# Patient Record
Sex: Male | Born: 1950 | Race: Black or African American | Hispanic: No | Marital: Married | Smoking: Never smoker
Health system: Southern US, Community
[De-identification: ages and names within clinical notes are randomized; demographics above are authoritative.]

## PROBLEM LIST (undated history)

## (undated) DIAGNOSIS — R351 Nocturia: Secondary | ICD-10-CM

## (undated) DIAGNOSIS — K219 Gastro-esophageal reflux disease without esophagitis: Secondary | ICD-10-CM

## (undated) DIAGNOSIS — N411 Chronic prostatitis: Secondary | ICD-10-CM

## (undated) DIAGNOSIS — R079 Chest pain, unspecified: Secondary | ICD-10-CM

## (undated) DIAGNOSIS — R972 Elevated prostate specific antigen [PSA]: Secondary | ICD-10-CM

## (undated) DIAGNOSIS — I10 Essential (primary) hypertension: Secondary | ICD-10-CM

## (undated) DIAGNOSIS — E785 Hyperlipidemia, unspecified: Secondary | ICD-10-CM

## (undated) DIAGNOSIS — E042 Nontoxic multinodular goiter: Secondary | ICD-10-CM

## (undated) DIAGNOSIS — D649 Anemia, unspecified: Secondary | ICD-10-CM

## (undated) DIAGNOSIS — K635 Polyp of colon: Secondary | ICD-10-CM

## (undated) HISTORY — DX: Chronic prostatitis: N41.1

## (undated) HISTORY — DX: Nontoxic multinodular goiter: E04.2

## (undated) HISTORY — PX: HERNIA REPAIR: SHX51

## (undated) HISTORY — PX: COLONOSCOPY: SHX174

## (undated) HISTORY — DX: Hyperlipidemia, unspecified: E78.5

## (undated) HISTORY — DX: Nocturia: R35.1

## (undated) HISTORY — DX: Anemia, unspecified: D64.9

## (undated) HISTORY — DX: Elevated prostate specific antigen (PSA): R97.20

## (undated) HISTORY — DX: Essential (primary) hypertension: I10

---

## 1999-09-26 ENCOUNTER — Ambulatory Visit (HOSPITAL_COMMUNITY): Admission: RE | Admit: 1999-09-26 | Discharge: 1999-09-26 | Payer: Self-pay | Admitting: Gastroenterology

## 2004-12-13 ENCOUNTER — Ambulatory Visit: Payer: Self-pay | Admitting: Family Medicine

## 2005-01-07 ENCOUNTER — Ambulatory Visit: Payer: Self-pay | Admitting: Family Medicine

## 2005-02-13 ENCOUNTER — Ambulatory Visit: Payer: Self-pay | Admitting: Gastroenterology

## 2005-02-27 ENCOUNTER — Ambulatory Visit: Payer: Self-pay | Admitting: Gastroenterology

## 2005-10-01 ENCOUNTER — Ambulatory Visit: Payer: Self-pay | Admitting: Family Medicine

## 2005-11-03 ENCOUNTER — Ambulatory Visit: Payer: Self-pay | Admitting: Family Medicine

## 2006-03-04 ENCOUNTER — Ambulatory Visit: Payer: Self-pay | Admitting: Family Medicine

## 2006-03-04 LAB — CONVERTED CEMR LAB
Basophils Relative: 0.2 % (ref 0.0–1.0)
Bilirubin, Direct: 0.1 mg/dL (ref 0.0–0.3)
CO2: 28 meq/L (ref 19–32)
Eosinophils Relative: 3.4 % (ref 0.0–5.0)
GFR calc Af Amer: 112 mL/min
Glucose, Bld: 92 mg/dL (ref 70–99)
HDL: 41 mg/dL (ref >39.0)
Hemoglobin: 13.1 g/dL (ref 13.0–17.0)
Lymphocytes Relative: 56.5 % — ABNORMAL HIGH (ref 12.0–46.0)
Monocytes Absolute: 0.4 10*3/uL (ref 0.2–0.7)
Neutro Abs: 1.4 10*3/uL (ref 1.4–7.7)
Potassium: 4 meq/L (ref 3.5–5.1)
TSH: 0.73 microintl units/mL (ref 0.35–5.50)
Total Protein: 6.3 g/dL (ref 6.0–8.3)
VLDL: 12 mg/dL (ref 0–40)
WBC: 4.5 10*3/uL (ref 4.5–10.5)

## 2006-03-11 ENCOUNTER — Ambulatory Visit: Payer: Self-pay | Admitting: Family Medicine

## 2006-03-26 ENCOUNTER — Ambulatory Visit: Payer: Self-pay

## 2006-04-20 ENCOUNTER — Ambulatory Visit: Payer: Self-pay

## 2006-04-20 ENCOUNTER — Encounter: Payer: Self-pay | Admitting: Cardiology

## 2006-09-30 ENCOUNTER — Encounter: Payer: Self-pay | Admitting: Family Medicine

## 2007-04-14 ENCOUNTER — Ambulatory Visit: Payer: Self-pay | Admitting: Family Medicine

## 2007-04-14 DIAGNOSIS — Z87448 Personal history of other diseases of urinary system: Secondary | ICD-10-CM | POA: Insufficient documentation

## 2007-04-14 LAB — CONVERTED CEMR LAB
Blood in Urine, dipstick: NEGATIVE
Ketones, urine, test strip: NEGATIVE
Nitrite: NEGATIVE
WBC Urine, dipstick: NEGATIVE

## 2007-04-19 LAB — CONVERTED CEMR LAB
ALT: 22 units/L (ref 0–53)
AST: 25 units/L (ref 0–37)
Albumin: 3.9 g/dL (ref 3.5–5.2)
Alkaline Phosphatase: 62 units/L (ref 39–117)
BUN: 9 mg/dL (ref 6–23)
Basophils Relative: 0 % (ref 0.0–1.0)
CO2: 30 meq/L (ref 19–32)
Chloride: 109 meq/L (ref 96–112)
Creatinine, Ser: 0.9 mg/dL (ref 0.4–1.5)
Eosinophils Absolute: 0.2 10*3/uL (ref 0.0–0.7)
Eosinophils Relative: 4.7 % (ref 0.0–5.0)
Glucose, Bld: 81 mg/dL (ref 70–99)
Lymphocytes Relative: 60.9 % — ABNORMAL HIGH (ref 12.0–46.0)
MCV: 90.1 fL (ref 78.0–100.0)
Monocytes Relative: 7.7 % (ref 3.0–12.0)
Neutrophils Relative %: 26.7 % — ABNORMAL LOW (ref 43.0–77.0)
Phosphorus: 3.1 mg/dL (ref 2.3–4.6)
RBC: 4.5 M/uL (ref 4.22–5.81)
Total CHOL/HDL Ratio: 4.1
Total Protein: 6.8 g/dL (ref 6.0–8.3)
VLDL: 11 mg/dL (ref 0–40)
WBC: 5.2 10*3/uL (ref 4.5–10.5)

## 2007-04-21 ENCOUNTER — Ambulatory Visit: Payer: Self-pay | Admitting: Family Medicine

## 2007-04-21 LAB — FECAL OCCULT BLOOD, GUAIAC: Fecal Occult Blood: NEGATIVE

## 2007-04-22 ENCOUNTER — Encounter: Payer: Self-pay | Admitting: Family Medicine

## 2007-04-30 ENCOUNTER — Ambulatory Visit: Payer: Self-pay | Admitting: Family Medicine

## 2007-05-06 ENCOUNTER — Encounter: Payer: Self-pay | Admitting: Family Medicine

## 2008-05-24 ENCOUNTER — Ambulatory Visit: Payer: Self-pay | Admitting: Family Medicine

## 2008-05-24 LAB — CONVERTED CEMR LAB
Bilirubin Urine: NEGATIVE
Glucose, Urine, Semiquant: NEGATIVE
Ketones, urine, test strip: NEGATIVE
Urobilinogen, UA: 0.2
pH: 6

## 2008-05-31 ENCOUNTER — Ambulatory Visit: Payer: Self-pay | Admitting: Family Medicine

## 2008-05-31 LAB — HM COLONOSCOPY

## 2008-06-09 LAB — CONVERTED CEMR LAB
ALT: 27 units/L (ref 0–53)
BUN: 11 mg/dL (ref 6–23)
Basophils Absolute: 0 10*3/uL (ref 0.0–0.1)
Bilirubin, Direct: 0.2 mg/dL (ref 0.0–0.3)
Chloride: 113 meq/L — ABNORMAL HIGH (ref 96–112)
Cholesterol: 146 mg/dL (ref 0–200)
Creatinine, Ser: 0.9 mg/dL (ref 0.4–1.5)
Eosinophils Absolute: 0.1 10*3/uL (ref 0.0–0.7)
Eosinophils Relative: 2.2 % (ref 0.0–5.0)
GFR calc non Af Amer: 111 mL/min (ref >60)
Glucose, Bld: 95 mg/dL (ref 70–99)
HCT: 38.2 % — ABNORMAL LOW (ref 39.0–52.0)
LDL Cholesterol: 99 mg/dL (ref 0–99)
Lymphs Abs: 2.5 10*3/uL (ref 0.7–4.0)
MCV: 90.1 fL (ref 78.0–100.0)
Monocytes Absolute: 0.4 10*3/uL (ref 0.1–1.0)
Neutrophils Relative %: 29.1 % — ABNORMAL LOW (ref 43.0–77.0)
PSA: 0.91 ng/mL (ref 0.10–4.00)
Platelets: 126 10*3/uL — ABNORMAL LOW (ref 150.0–400.0)
RDW: 12.9 % (ref 11.5–14.6)
TSH: 0.67 microintl units/mL (ref 0.35–5.50)
Total Bilirubin: 1.4 mg/dL — ABNORMAL HIGH (ref 0.3–1.2)
Triglycerides: 74 mg/dL (ref 0.0–149.0)
VLDL: 14.8 mg/dL (ref 0.0–40.0)
WBC: 4.2 10*3/uL — ABNORMAL LOW (ref 4.5–10.5)

## 2008-07-04 ENCOUNTER — Ambulatory Visit: Payer: Self-pay | Admitting: Family Medicine

## 2008-08-04 ENCOUNTER — Telehealth: Payer: Self-pay | Admitting: Family Medicine

## 2008-12-12 ENCOUNTER — Encounter: Payer: Self-pay | Admitting: Family Medicine

## 2009-05-24 ENCOUNTER — Telehealth: Payer: Self-pay | Admitting: Family Medicine

## 2009-06-19 ENCOUNTER — Telehealth: Payer: Self-pay | Admitting: Family Medicine

## 2009-06-19 ENCOUNTER — Ambulatory Visit: Payer: Self-pay | Admitting: Family Medicine

## 2009-06-26 ENCOUNTER — Ambulatory Visit: Payer: Self-pay | Admitting: Family Medicine

## 2009-06-26 DIAGNOSIS — J310 Chronic rhinitis: Secondary | ICD-10-CM | POA: Insufficient documentation

## 2009-06-26 DIAGNOSIS — R351 Nocturia: Secondary | ICD-10-CM

## 2009-06-26 DIAGNOSIS — M1990 Unspecified osteoarthritis, unspecified site: Secondary | ICD-10-CM | POA: Insufficient documentation

## 2009-06-28 LAB — CONVERTED CEMR LAB
Albumin: 3.5 g/dL (ref 3.5–5.2)
Basophils Absolute: 0 10*3/uL (ref 0.0–0.1)
Basophils Relative: 0.7 % (ref 0.0–3.0)
Bilirubin Urine: NEGATIVE
Bilirubin, Direct: 0.2 mg/dL (ref 0.0–0.3)
Blood in Urine, dipstick: NEGATIVE
CO2: 30 meq/L (ref 19–32)
Calcium: 8.6 mg/dL (ref 8.4–10.5)
Chloride: 108 meq/L (ref 96–112)
Cholesterol: 150 mg/dL (ref 0–200)
Creatinine, Ser: 0.9 mg/dL (ref 0.4–1.5)
Eosinophils Absolute: 0.3 10*3/uL (ref 0.0–0.7)
Glucose, Bld: 85 mg/dL (ref 70–99)
Glucose, Urine, Semiquant: NEGATIVE
HDL: 44 mg/dL (ref >39.00)
Ketones, urine, test strip: NEGATIVE
MCHC: 33.2 g/dL (ref 30.0–36.0)
MCV: 91.6 fL (ref 78.0–100.0)
Monocytes Absolute: 0.4 10*3/uL (ref 0.1–1.0)
Neutro Abs: 1.4 10*3/uL (ref 1.4–7.7)
Neutrophils Relative %: 29.9 % — ABNORMAL LOW (ref 43.0–77.0)
Nitrite: NEGATIVE
PSA: 1.07 ng/mL (ref 0.10–4.00)
RBC: 4.11 M/uL — ABNORMAL LOW (ref 4.22–5.81)
RDW: 14.1 % (ref 11.5–14.6)
Specific Gravity, Urine: 1.025
Total CHOL/HDL Ratio: 3
Total Protein: 5.9 g/dL — ABNORMAL LOW (ref 6.0–8.3)
Triglycerides: 54 mg/dL (ref 0.0–149.0)
pH: 6

## 2009-07-05 ENCOUNTER — Ambulatory Visit: Payer: Self-pay | Admitting: Family Medicine

## 2009-07-25 LAB — CONVERTED CEMR LAB
OCCULT 1: NEGATIVE
OCCULT 2: NEGATIVE

## 2009-07-26 ENCOUNTER — Encounter: Payer: Self-pay | Admitting: Family Medicine

## 2010-02-05 ENCOUNTER — Encounter: Payer: Self-pay | Admitting: Internal Medicine

## 2010-02-05 NOTE — Letter (Signed)
Summary: Generic Letter  [place] [facility]  [address] [place], [place] [address]   Phone: [phone]  Fax: [phone]    07/26/2009  JAHZIAH SIMONIN [address] [facility] [address] [place], [place]  [address]  Dear Mr. Ochs,   Hemocult cards were negative.        Sincerely,   Dr Gwenyth Bender Stafford,MD

## 2010-02-05 NOTE — Assessment & Plan Note (Signed)
Summary: CPX // RS   Vital Signs:  Patient profile:   60 year old male Height:      70 inches Weight:      191 pounds BMI:     27.50 O2 Sat:      97 % Temp:     98.7 degrees F Pulse rate:   55 / minute Pulse rhythm:   regular BP sitting:   140 / 94  (left arm) Cuff size:   large  Vitals Entered By: Pura Spice, RN (June 26, 2009 3:03 PM)   Contraindications/Deferment of Procedures/Staging:    Test/Procedure: TD vaccine    Reason for deferment: declined  CC: cpx need   ref[facility]    History of Present Illness: This 60 year old Afro-American male presents for complete physical examination, was married in the past year and went to [place] for a Honeymoon Arthritis does help her much with Celebrex one b.i.d. Bacteria is treated with alternate route for Flolan Flomax, both help Patient went to Dr. Annalee Genta otolaryngologist regarding nasal allergy and was treated Astepro 0.5  %       Allergies (verified): No Known Drug Allergies  Past History:  Risk Factors: Smoking Status: never (09/18/2006)  Review of Systems      See HPI General:  Denies chills, fatigue, fever, loss of appetite, malaise, sleep disorder, sweats, weakness, and weight loss. Eyes:  Denies blurring, discharge, double vision, eye irritation, eye pain, halos, itching, light sensitivity, red eye, vision loss-1 eye, and vision loss-both eyes. ENT:  Complains of nasal congestion; Astepro 0.5 % . CV:  Denies bluish discoloration of lips or nails, chest pain or discomfort, difficulty breathing at night, difficulty breathing while lying down, fainting, fatigue, leg cramps with exertion, lightheadness, near fainting, palpitations, shortness of breath with exertion, swelling of feet, swelling of hands, and weight gain. Resp:  Denies chest discomfort, chest pain with inspiration, cough, coughing up blood, excessive snoring, hypersomnolence, morning headaches, pleuritic, shortness of breath, sputum  productive, and wheezing. GI:  Denies abdominal pain, bloody stools, change in bowel habits, constipation, dark tarry stools, diarrhea, excessive appetite, gas, hemorrhoids, indigestion, loss of appetite, nausea, vomiting, vomiting blood, and yellowish skin color. GU:  Complains of nocturia; Rapoflo or Flomax helped the. MS:  Complains of joint pain; arthritis joint helped by Celebrex 200 mg b.i.d.. Derm:  Denies changes in color of skin, changes in nail beds, dryness, excessive perspiration, flushing, hair loss, insect bite(s), itching, lesion(s), poor wound healing, and rash. Neuro:  Denies brief paralysis, difficulty with concentration, disturbances in coordination, falling down, headaches, inability to speak, memory loss, numbness, poor balance, seizures, sensation of room spinning, tingling, tremors, visual disturbances, and weakness. Psych:  Denies alternate hallucination ( auditory/visual), anxiety, depression, easily angered, easily tearful, irritability, mental problems, panic attacks, sense of great danger, suicidal thoughts/plans, thoughts of violence, unusual visions or sounds, and thoughts /plans of harming others.  Physical Exam  General:  Well-developed,well-nourished,in no acute distress; alert,appropriate and cooperative throughout examination Head:  Normocephalic and atraumatic without obvious abnormalities. No apparent alopecia or balding. Eyes:  No corneal or conjunctival inflammation noted. EOMI. Perrla. Funduscopic exam benign, without hemorrhages, exudates or papilledema. Vision grossly normal. Ears:  External ear exam shows no significant lesions or deformities.  Otoscopic examination reveals clear canals, tympanic membranes are intact bilaterally without bulging, retraction, inflammation or discharge. Hearing is grossly normal bilaterally. Nose:  External nasal examination shows no deformity or inflammation. Nasal mucosa are pink and moist without lesions or  exudates. Mouth:   Oral mucosa and oropharynx without lesions or exudates.  Teeth in good repair. Neck:  No deformities, masses, or tenderness noted. Chest Wall:  No deformities, masses, tenderness or gynecomastia noted. Breasts:  No masses or gynecomastia noted Lungs:  Normal respiratory effort, chest expands symmetrically. Lungs are clear to auscultation, no crackles or wheezes. Heart:  Normal rate and regular rhythm. S1 and S2 normal without gallop, murmur, click, rub or other extra sounds. Abdomen:  Bowel sounds positive,abdomen soft and non-tender without masses, organomegaly or hernias noted. Rectal:  No external abnormalities noted. Normal sphincter tone. No rectal masses or tenderness. Genitalia:  Testes bilaterally descended without nodularity, tenderness or masses. No scrotal masses or lesions. No penis lesions or urethral discharge. Prostate:  1+ enlarged.   Msk:  No deformity or scoliosis noted of thoracic or lumbar spine.   Pulses:  R and L carotid,radial,femoral,dorsalis pedis and posterior tibial pulses are full and equal bilaterally Extremities:  No clubbing, cyanosis, edema, or deformity noted with normal full range of motion of all joints.   Neurologic:  No cranial nerve deficits noted. Station and gait are normal. Plantar reflexes are down-going bilaterally. DTRs are symmetrical throughout. Sensory, motor and coordinative functions appear intact. Skin:  Intact without suspicious lesions or rashes Cervical Nodes:  No lymphadenopathy noted Axillary Nodes:  No palpable lymphadenopathy Inguinal Nodes:  No significant adenopathy Psych:  Cognition and judgment appear intact. Alert and cooperative with normal attention span and concentration. No apparent delusions, illusions, hallucinations   Impression & Recommendations:  Problem # 1:  PHYSICAL EXAMINATION (ICD-V70.0) Assessment Unchanged  Orders: EKG w/ Interpretation (93000)normal electrocardiogram  Problem # 2:  OSTEOARTHRITIS  (ICD-715.90) Assessment: Improved  His updated medication list for this problem includes:    Aspirin 81 Mg Tbec (Aspirin)    Celebrex 200 Mg Caps (Celecoxib) .Marland Kitchen... 1 two times a day  Problem # 3:  RHINITIS (ICD-472.0) Assessment: Improved Asterpro0.5%  Problem # 4:  NOCTURIA (ZOX-096.04) Assessment: Improved Rapaflo or Flomax  Complete Medication List: 1)  Vitamin E 400 Unit Caps (Vitamin e) 2)  Aspirin 81 Mg Tbec (Aspirin) 3)  Celebrex 200 Mg Caps (Celecoxib) .Marland Kitchen.. 1 two times a day 4)  Maxiflu Dm 60-20-400-500 Mg Tabs (Pseudoephedrine-dm-gg-apap) .Marland Kitchen.. 1 morn, midafternoon and hs, for cough and congestipon 5)  Tandem Plus 162-115.2-1 Mg Caps (Fefum-fepo-fa-b cmp-c-zn-mn-cu) .Marland Kitchen.. 1 each day for anemia 6)  Rapaflo 8 Mg Caps (Silodosin) .Marland Kitchen.. 1 each day for better urination and to prevent getting up at night 7)  Flomax 0.4 Mg Caps (Tamsulosin hcl) .Marland Kitchen.. 1 qd 8)  Astepro 0.15 % Soln (Azelastine hcl) .... 2 sprays in each nostril qd  Patient Instructions: 1)  physical examination reveals a healthy male with his problem of arthritis and not use will control 2)  laboratory studies very good Prescriptions: TANDEM PLUS 162-115.2-1 MG CAPS (FEFUM-FEPO-FA-B CMP-C-ZN-MN-CU) 1 each day for anemia  #90 x 3   Entered and Authorized by:   Judithann Sheen MD   Signed by:   Judithann Sheen MD on 06/26/2009   Method used:   Print then Give to Patient   RxID:   [id number] RAPAFLO 8 MG CAPS (SILODOSIN) 1 each day for better urination and to prevent getting up at night  #90 x 3   Entered and Authorized by:   Judithann Sheen MD   Signed by:   Judithann Sheen MD on 06/26/2009   Method used:   Print then  Give to Patient   RxID:   [id number] ASTEPRO 0.15 % SOLN (AZELASTINE HCL) 2 sprays in each nostril qd  #3 x 3   Entered and Authorized by:   Judithann Sheen MD   Signed by:   Judithann Sheen MD on 06/26/2009   Method used:   Print then Give to Patient   RxID:    [id number] FLOMAX 0.4 MG CAPS (TAMSULOSIN HCL) 1 qd  #30 x 11   Entered and Authorized by:   Judithann Sheen MD   Signed by:   Judithann Sheen MD on 06/26/2009   Method used:   Print then Give to Patient   RxID:   [id number]

## 2010-02-05 NOTE — Progress Notes (Signed)
Summary: returning your call   Phone Note Call from Patient   Caller: Patient Summary of Call: returning your call  about his meds  Initial call taken by: Pura Spice, RN,  May 24, 2009 12:10 PM  Follow-up for Phone Call        called by dr Scotty Court. Follow-up by: Pura Spice, RN,  May 24, 2009 2:53 PM

## 2010-02-05 NOTE — Progress Notes (Signed)
Summary: written rx to pt for celebrex   Phone Note Call from Patient   Caller: Patient  walked in  Summary of Call: pt wants written rx   [facility] Initial call taken by: Pura Spice, RN,  June 19, 2009 10:46 AM  Follow-up for Phone Call        ok per dr Alfonzo Feller  Follow-up by: Pura Spice, RN,  June 19, 2009 10:46 AM    New/Updated Medications: CELEBREX 200 MG  CAPS (CELECOXIB) 1 two times a day Prescriptions: CELEBREX 200 MG  CAPS (CELECOXIB) 1 two times a day  #180 x 3   Entered by:   Pura Spice, RN   Authorized by:   Judithann Sheen MD   Signed by:   Pura Spice, RN on 06/19/2009   Method used:   Print then Give to Patient   RxID:   [id number]

## 2010-02-05 NOTE — Consult Note (Signed)
Summary: [facility]  [facility]   Imported By: Maryln Gottron 01/11/2009 09:43:09  _____________________________________________________________________  External Attachment:    Type:   Image     Comment:   External Document  Appended Document: [facility] reviewed

## 2010-02-13 NOTE — Letter (Signed)
Summary: Colonoscopy Letter  [facility]  [address] [place], [place] [address]   Phone: [phone]  Fax: [phone]      February 05, 2010 MRN: [id number]   Stephen Kramer [address] [facility] [address] [place], [place]  [address]   Dear Stephen Kramer,   According to your medical record, it is time for you to schedule a Colonoscopy. The American Cancer Society recommends this procedure as a method to detect early colon cancer. Patients with a family history of colon cancer, or a personal history of colon polyps or inflammatory bowel disease are at increased risk.  This letter has been generated based on the recommendations made at the time of your procedure. If you feel that in your particular situation this may no longer apply, please contact our office.  Please call our office at [phone] to schedule this appointment or to update your records at your earliest convenience.  Thank you for cooperating with [place] to provide you with the very best care possible.   Sincerely,  Wilhemina Bonito. Marina Goodell, M.D.  [facility] [phone]

## 2010-06-25 ENCOUNTER — Other Ambulatory Visit (INDEPENDENT_AMBULATORY_CARE_PROVIDER_SITE_OTHER): Payer: BC Managed Care – PPO

## 2010-06-25 DIAGNOSIS — Z0000 Encounter for general adult medical examination without abnormal findings: Secondary | ICD-10-CM

## 2010-06-25 LAB — CBC WITH DIFFERENTIAL/PLATELET
Basophils Absolute: 0 10*3/uL (ref 0.0–0.1)
HCT: 41 % (ref 39.0–52.0)
Lymphs Abs: 2.7 10*3/uL (ref 0.7–4.0)
MCHC: 33 g/dL (ref 30.0–36.0)
MCV: 91.4 fl (ref 78.0–100.0)
Monocytes Absolute: 0.4 10*3/uL (ref 0.1–1.0)
Platelets: 150 10*3/uL (ref 150.0–400.0)
RDW: 14.6 % (ref 11.5–14.6)

## 2010-06-25 LAB — POCT URINALYSIS DIPSTICK
Blood, UA: NEGATIVE
Glucose, UA: NEGATIVE
Spec Grav, UA: 1.02
Urobilinogen, UA: 0.2

## 2010-06-25 LAB — HEPATIC FUNCTION PANEL
ALT: 23 U/L (ref 0–53)
Total Bilirubin: 1.9 mg/dL — ABNORMAL HIGH (ref 0.3–1.2)

## 2010-06-25 LAB — BASIC METABOLIC PANEL
BUN: 13 mg/dL (ref 6–23)
Chloride: 109 mEq/L (ref 96–112)
Glucose, Bld: 90 mg/dL (ref 70–99)
Potassium: 4.7 mEq/L (ref 3.5–5.1)

## 2010-06-25 LAB — LIPID PANEL
Cholesterol: 153 mg/dL (ref 0–200)
LDL Cholesterol: 98 mg/dL (ref 0–99)
Triglycerides: 63 mg/dL (ref 0.0–149.0)
VLDL: 12.6 mg/dL (ref 0.0–40.0)

## 2010-06-25 LAB — TSH: TSH: 1.01 u[IU]/mL (ref 0.35–5.50)

## 2010-06-28 ENCOUNTER — Encounter: Payer: Self-pay | Admitting: Family Medicine

## 2010-07-02 ENCOUNTER — Ambulatory Visit (INDEPENDENT_AMBULATORY_CARE_PROVIDER_SITE_OTHER): Payer: BC Managed Care – PPO | Admitting: Family Medicine

## 2010-07-02 ENCOUNTER — Encounter: Payer: Self-pay | Admitting: Family Medicine

## 2010-07-02 VITALS — BP 128/80 | HR 56 | Temp 98.7°F | Ht 70.0 in | Wt 185.0 lb

## 2010-07-02 DIAGNOSIS — Z0000 Encounter for general adult medical examination without abnormal findings: Secondary | ICD-10-CM

## 2010-07-02 DIAGNOSIS — M1990 Unspecified osteoarthritis, unspecified site: Secondary | ICD-10-CM

## 2010-07-02 DIAGNOSIS — M129 Arthropathy, unspecified: Secondary | ICD-10-CM

## 2010-07-02 DIAGNOSIS — N419 Inflammatory disease of prostate, unspecified: Secondary | ICD-10-CM

## 2010-07-02 MED ORDER — SILODOSIN 8 MG PO CAPS: 8.0000 mg | ORAL_CAPSULE | Freq: Every day | ORAL | Status: DC

## 2010-07-02 MED ORDER — CELECOXIB 200 MG PO CAPS: 200.0000 mg | ORAL_CAPSULE | Freq: Two times a day (BID) | ORAL | Status: DC

## 2010-07-02 MED ORDER — CIPROFLOXACIN HCL 500 MG PO TABS: 500.0000 mg | ORAL_TABLET | Freq: Two times a day (BID) | ORAL | Status: AC

## 2010-07-18 ENCOUNTER — Telehealth: Payer: Self-pay

## 2010-07-25 NOTE — Telephone Encounter (Signed)
Pt received his medication from mail order.

## 2010-07-26 ENCOUNTER — Encounter: Payer: Self-pay | Admitting: Internal Medicine

## 2010-07-29 ENCOUNTER — Encounter: Payer: Self-pay | Admitting: Family Medicine

## 2010-07-29 NOTE — Progress Notes (Signed)
  Subjective:    Patient ID: Stephen Kramer, male    DOB: 12-19-1950, 60 y.o.   MRN: [id number] This 60 year old Afro-American is in for his yearly physical examination Gen. H. is been doing for well over the past year however at times has symptoms of chronic prostatitis which he had been treated one time in the past year with Cipro 500 mg twice a day for one month he had been doing very well until recently and does have recurrent for increased nocturia as well as some pain over the suprapubic region as well as the perineum he continues to have some joint discomfort but not severe Continued to take Celebrex      HPI    Review of Systems  Constitutional: Negative for activity change, appetite change, fatigue and unexpected weight change.  Eyes: Negative.   Respiratory: Negative.   Cardiovascular: Negative.   Gastrointestinal: Negative.   Genitourinary: Negative for dysuria.       Nocturia. History of chronic prostatitis occasional has suprapubic pain and perineal pain  Musculoskeletal: Positive for arthralgias.  Skin: Negative.   Neurological: Negative.   Psychiatric/Behavioral: Negative.        Objective:   Physical Exam the patient is well-developed well-nourished black male who is in no distress cooperative and pleasant HEENT negative carotid pulses good thyroid is normal Lungs clear to palpation percussion auscultation . No dullness no wheezing no rales Heart no evidence of cardiomegaly heart sounds are good without murmurs rectal rectal peripheral pulses are good and equal bilaterally Abdomen liver spleen kidneys nonpalpable no masses percussion of aorta normal. Bowel sounds  Normal Genitalia normal Rectal examination reveals prostate to be enlarged x2 very tender boggy no nodules Extremities negative Skin        Assessment & Plan:  Physical examination reveals a normal healthy male Osteoarthritis to treat with Celebrex Nocturia to treat with rapoflo Prostatitis  to treat with Cipro 500 mg twice a day x1 month also taking Celebrex helps the discomfort

## 2010-07-29 NOTE — Patient Instructions (Signed)
I found a physical examination in general is very good and very pleased if you have lost some weight laboratory studies were good as we discussed As you know you have prostatitis and we will treat was Cipro 500 mg twice a day for one month Continue Celebrex as prescribed Return in one month for examination

## 2010-08-28 ENCOUNTER — Ambulatory Visit (AMBULATORY_SURGERY_CENTER): Payer: BC Managed Care – PPO | Admitting: *Deleted

## 2010-08-28 VITALS — Ht 70.0 in | Wt 186.0 lb

## 2010-08-28 DIAGNOSIS — Z1211 Encounter for screening for malignant neoplasm of colon: Secondary | ICD-10-CM

## 2010-08-28 MED ORDER — SUPREP BOWEL PREP KIT 17.5-3.13-1.6 GM/177ML PO SOLN: 1.0000 | Freq: Once | ORAL | Status: DC

## 2010-09-11 ENCOUNTER — Ambulatory Visit (AMBULATORY_SURGERY_CENTER): Payer: BC Managed Care – PPO | Admitting: Internal Medicine

## 2010-09-11 ENCOUNTER — Encounter: Payer: Self-pay | Admitting: Internal Medicine

## 2010-09-11 VITALS — BP 137/86 | HR 53 | Temp 97.9°F | Resp 13 | Ht 70.0 in | Wt 186.0 lb

## 2010-09-11 DIAGNOSIS — Z1211 Encounter for screening for malignant neoplasm of colon: Secondary | ICD-10-CM

## 2010-09-11 DIAGNOSIS — Z800 Family history of malignant neoplasm of digestive organs: Secondary | ICD-10-CM

## 2010-09-11 DIAGNOSIS — D126 Benign neoplasm of colon, unspecified: Secondary | ICD-10-CM

## 2010-09-11 MED ORDER — SODIUM CHLORIDE 0.9 % IV SOLN: 500.0000 mL | INTRAVENOUS | Status: DC

## 2010-09-11 NOTE — Patient Instructions (Signed)
Discharge instructions given with verbal understanding. Handouts on polyps,diverticulosis and a high fiber diet given. Resume previous medications. 

## 2010-09-12 ENCOUNTER — Telehealth: Payer: Self-pay | Admitting: *Deleted

## 2010-09-12 NOTE — Telephone Encounter (Signed)
No ID on voice mail.   No message left. 

## 2010-10-17 ENCOUNTER — Other Ambulatory Visit: Payer: Self-pay | Admitting: Family Medicine

## 2010-10-24 ENCOUNTER — Other Ambulatory Visit: Payer: Self-pay | Admitting: Family Medicine

## 2010-10-29 ENCOUNTER — Encounter: Payer: Self-pay | Admitting: Internal Medicine

## 2010-10-29 ENCOUNTER — Ambulatory Visit (INDEPENDENT_AMBULATORY_CARE_PROVIDER_SITE_OTHER): Payer: BC Managed Care – PPO | Admitting: Internal Medicine

## 2010-10-29 VITALS — BP 130/80 | Temp 98.5°F | Wt 189.0 lb

## 2010-10-29 DIAGNOSIS — Z87448 Personal history of other diseases of urinary system: Secondary | ICD-10-CM

## 2010-10-29 NOTE — Patient Instructions (Signed)
Complete Cipro antibiotic therapy  Continue Celebrex twice daily  Call or return to clinic prn if these symptoms worsen or fail to improve as anticipated.

## 2010-10-29 NOTE — Progress Notes (Signed)
  Subjective:    Patient ID: Stephen Kramer, male    DOB: 1950/02/22, 60 y.o.   MRN: [id number]  HPI 60 year old patient who has a greater than 20 year history of chronic recurrent prostatitis. He has been on Cipro for 3 months and states that he is about 60% improved. He states over the past 20 years he has never really been symptom free for any significant length of time. He has been evaluated by urology. Medical regimen includes Celebrex he takes twice daily when he tries to down titrate to once daily he has recurrent symptoms. He is also on rapaflo  8 mg daily. No fever   Review of Systems  Constitutional: Negative for fever, chills, appetite change and fatigue.  HENT: Negative for hearing loss, ear pain, congestion, sore throat, trouble swallowing, neck stiffness, dental problem, voice change and tinnitus.   Eyes: Negative for pain, discharge and visual disturbance.  Respiratory: Negative for cough, chest tightness, wheezing and stridor.   Cardiovascular: Negative for chest pain, palpitations and leg swelling.  Gastrointestinal: Negative for nausea, vomiting, abdominal pain, diarrhea, constipation, blood in stool and abdominal distention.  Genitourinary: Negative for urgency, hematuria, flank pain, discharge, difficulty urinating and genital sores.       Perineal pain  Musculoskeletal: Negative for myalgias, back pain, joint swelling, arthralgias and gait problem.  Skin: Negative for rash.  Neurological: Negative for dizziness, syncope, speech difficulty, weakness, numbness and headaches.  Hematological: Negative for adenopathy. Does not bruise/bleed easily.  Psychiatric/Behavioral: Negative for behavioral problems and dysphoric mood. The patient is not nervous/anxious.        Objective:   Physical Exam  Constitutional: He appears well-developed and well-nourished. No distress.          Assessment & Plan:   Chronic prostatitis. He was recommended that he continue Celebrex  twice a day as well as the alpha-blocker. He will complete Cipro and then will be observed off antibiotic therapy. He will call if there are any recurrent symptoms.

## 2010-10-31 ENCOUNTER — Other Ambulatory Visit: Payer: Self-pay

## 2010-10-31 MED ORDER — CELECOXIB 200 MG PO CAPS: 200.0000 mg | ORAL_CAPSULE | Freq: Two times a day (BID) | ORAL | Status: AC

## 2010-10-31 NOTE — Telephone Encounter (Signed)
Pt need celebrex 200 mg #180 with 3 refills [facility] into [facility] [phone] also call in 2 wk supply to [facility] [phone]. Pt is out of med

## 2010-10-31 NOTE — Telephone Encounter (Signed)
2 week supply sent to to local pharmacy.

## 2010-11-07 ENCOUNTER — Telehealth: Payer: Self-pay | Admitting: Family Medicine

## 2010-11-07 NOTE — Telephone Encounter (Signed)
Patient is confused about his Celebrex refills. Please advise and return his call. Thanks.

## 2010-11-08 MED ORDER — CELECOXIB 200 MG PO CAPS: 200.0000 mg | ORAL_CAPSULE | Freq: Two times a day (BID) | ORAL | Status: AC

## 2010-11-08 NOTE — Telephone Encounter (Signed)
Pt came into the office upset about not receiving his rx prescription by mail order.  Pt told that celebrex rx was sent in on 07/02/10 by Dr. Scotty Court [id number].  Pt states the pharmacy told him the rx was never received. Pt requested to have prescription printed out so that he can take it to the pharmacy.  Ok per Dr. Clent Ridges to print out prescription.  Pt is aware that effective immediately Dr. Scotty Court has retired.

## 2010-11-08 NOTE — Telephone Encounter (Signed)
Left a message for pt to return call 

## 2011-08-06 ENCOUNTER — Telehealth: Payer: Self-pay

## 2011-08-06 NOTE — Telephone Encounter (Signed)
Pt would like a refill on the cipro, pt states that he is still in pain and has seen little improvement in his symptoms. Best# [phone]

## 2011-08-07 NOTE — Telephone Encounter (Signed)
We have not patient for this problem, so he would need to come in for an office visit first.

## 2011-08-07 NOTE — Telephone Encounter (Signed)
Left message on machine that he has not been seen since before 01/2011 and that he would need an OV before we could give him any abx.

## 2011-10-03 ENCOUNTER — Ambulatory Visit (INDEPENDENT_AMBULATORY_CARE_PROVIDER_SITE_OTHER): Payer: BC Managed Care – PPO | Admitting: Urology

## 2011-10-03 DIAGNOSIS — N529 Male erectile dysfunction, unspecified: Secondary | ICD-10-CM

## 2011-10-03 DIAGNOSIS — R3915 Urgency of urination: Secondary | ICD-10-CM

## 2011-10-03 DIAGNOSIS — R3989 Other symptoms and signs involving the genitourinary system: Secondary | ICD-10-CM

## 2011-12-31 ENCOUNTER — Emergency Department (HOSPITAL_COMMUNITY)
Admission: EM | Admit: 2011-12-31 | Discharge: 2011-12-31 | Disposition: A | Discharge status: Home / Self Care | Payer: BC Managed Care – PPO | Attending: Emergency Medicine | Admitting: Emergency Medicine

## 2011-12-31 ENCOUNTER — Encounter (HOSPITAL_COMMUNITY): Payer: Self-pay | Admitting: Emergency Medicine

## 2011-12-31 ENCOUNTER — Emergency Department (HOSPITAL_COMMUNITY): Payer: BC Managed Care – PPO

## 2011-12-31 DIAGNOSIS — J111 Influenza due to unidentified influenza virus with other respiratory manifestations: Secondary | ICD-10-CM

## 2011-12-31 DIAGNOSIS — R109 Unspecified abdominal pain: Secondary | ICD-10-CM | POA: Insufficient documentation

## 2011-12-31 DIAGNOSIS — Z87448 Personal history of other diseases of urinary system: Secondary | ICD-10-CM | POA: Insufficient documentation

## 2011-12-31 DIAGNOSIS — R05 Cough: Secondary | ICD-10-CM | POA: Insufficient documentation

## 2011-12-31 DIAGNOSIS — R509 Fever, unspecified: Secondary | ICD-10-CM | POA: Insufficient documentation

## 2011-12-31 DIAGNOSIS — J1189 Influenza due to unidentified influenza virus with other manifestations: Secondary | ICD-10-CM | POA: Insufficient documentation

## 2011-12-31 DIAGNOSIS — R059 Cough, unspecified: Secondary | ICD-10-CM | POA: Insufficient documentation

## 2011-12-31 DIAGNOSIS — J3489 Other specified disorders of nose and nasal sinuses: Secondary | ICD-10-CM | POA: Insufficient documentation

## 2011-12-31 DIAGNOSIS — R112 Nausea with vomiting, unspecified: Secondary | ICD-10-CM | POA: Insufficient documentation

## 2011-12-31 DIAGNOSIS — R51 Headache: Secondary | ICD-10-CM | POA: Insufficient documentation

## 2011-12-31 MED ORDER — ONDANSETRON 4 MG PO TBDP: 4.0000 mg | ORAL_TABLET | Freq: Three times a day (TID) | ORAL | Status: DC | PRN

## 2011-12-31 MED ORDER — ONDANSETRON 4 MG PO TBDP
4.0000 mg | ORAL_TABLET | Freq: Once | ORAL | Status: AC
  Administered 2011-12-31: 4 mg via ORAL
  Filled 2011-12-31: qty 1

## 2011-12-31 NOTE — ED Provider Notes (Signed)
History  This chart was scribed for Shelda Jakes, MD by Bennett Scrape, ED Scribe. This patient was seen in room APA04/APA04 and the patient's care was started at 7:10 AM.  CSN: [id number]  Arrival date & time 12/31/11  4696   First MD Initiated Contact with Patient 12/31/11 0710      Chief Complaint  Patient presents with  . Generalized Body Aches     The history is provided by the patient. No language interpreter was used.   Stephen Kramer is a 61 y.o. male who presents to the Emergency Department complaining of 2 days of gradual onset, gradually worsening, constant of generalized myalgias with associated nasal congestion, cough productive of phlegm, chills, subjective fevers, mild HA, mild abdominal pain, nausea and 3 episodes of emesis. Temperature is 99.2 in the ED. He reports that the nausea and emesis started yesterday and he denies any emesis episodes today. He states that he has taken 2 doses of Robitussin with no improvement in symptoms. He denies having any sick contacts with similar symptoms. He denies CP, sore throat, rash and diarrhea as associated symptoms. He has a h/o chronic prostatitis and he is an occasional alcohol user but denies smoking. He denies having a flu vaccine this year.  PCP is Dr. Phillips Odor.  Past Medical History  Diagnosis Date  . Chronic prostatitis   . Nocturia     Past Surgical History  Procedure Date  . Colonoscopy     Family History  Problem Relation Age of Onset  . Colon cancer Brother     History  Substance Use Topics  . Smoking status: Never Smoker   . Smokeless tobacco: Never Used  . Alcohol Use: 2.4 oz/week    4 Cans of beer per week      Review of Systems  Constitutional: Positive for fever and chills.  HENT: Positive for congestion. Negative for sore throat.   Respiratory: Positive for cough. Negative for shortness of breath.   Cardiovascular: Negative for chest pain.  Gastrointestinal: Positive for nausea,  vomiting and abdominal pain (mild). Negative for diarrhea.  Genitourinary: Negative for dysuria and hematuria.  Musculoskeletal: Positive for myalgias.  Skin: Negative for rash.  Neurological: Positive for headaches. Negative for seizures.  Hematological: Does not bruise/bleed easily.    Allergies  Review of patient's allergies indicates no known allergies.  Home Medications   Current Outpatient Rx  Name  Route  Sig  Dispense  Refill  . ASPIRIN 81 MG PO TABS   Oral   Take 81 mg by mouth daily as needed.          Marland Kitchen CIPROFLOXACIN HCL 500 MG PO TABS   Oral   Take 500 mg by mouth 2 (two) times daily. For 12 weeks          . ONDANSETRON 4 MG PO TBDP   Oral   Take 1 tablet (4 mg total) by mouth every 8 (eight) hours as needed for nausea.   12 tablet   0   . RAPAFLO 8 MG PO CAPS      TAKE 1 CAPSULE DAILY FOR   BETTER URINATION AND       PREVENT GETTINGUP AT NIGHT   90 capsule   2     Dispense as written.     Triage Vitals: BP 158/81  Pulse 72  Temp 99.2 F (37.3 C) (Oral)  Resp 18  Ht 5\' 10"  (1.778 m)  Wt 188 lb (85.276 kg)  BMI  26.98 kg/m2  SpO2 100%  Physical Exam  Nursing note and vitals reviewed. Constitutional: He is oriented to person, place, and time. He appears well-developed and well-nourished. No distress.  HENT:  Head: Normocephalic and atraumatic.       Moist mucous membranes  Eyes: Conjunctivae normal and EOM are normal. Pupils are equal, round, and reactive to light. No scleral icterus.  Neck: Normal range of motion. Neck supple. No tracheal deviation present.  Cardiovascular: Normal rate and regular rhythm.   No murmur heard. Pulmonary/Chest: Effort normal and breath sounds normal. No respiratory distress.  Abdominal: Soft. Bowel sounds are normal. There is no tenderness.  Musculoskeletal: Normal range of motion. He exhibits no edema.  Lymphadenopathy:    He has no cervical adenopathy.  Neurological: He is alert and oriented to person,  place, and time. No cranial nerve deficit.       Pt able to move both sets of fingers and toes  Skin: Skin is warm and dry.  Psychiatric: He has a normal mood and affect. His behavior is normal.    ED Course  Procedures (including critical care time)  DIAGNOSTIC STUDIES: Oxygen Saturation is 100% on room air, normal by my interpretation.    COORDINATION OF CARE: 7:41 AM- Advised pt that he most likely has something viral and that he will need to drink fluids to stay hydrated. Discussed treatment plan which includes CXR and antiemetics with pt at bedside and pt agreed to plan.  8:15 AM- Ordered one 4 mg Zofran tablet.  9:00 AM- Pt rechecked and feels improved with the Zofran. Informed pt of CXR results. Discussed discharge plan and pt agreed to plan. Also advised pt to follow up with PCP and pt agreed.  Labs Reviewed - No data to display Dg Chest 2 View  12/31/2011  *RADIOLOGY REPORT*  Clinical Data: Myalgias  CHEST - 2 VIEW  Comparison: None.  Findings: Aorta is ectatic and unfolded.  Heart size is normal. Minimal linear right greater than left lower lobe scarring or atelectasis.  Trace pleural effusions are present.  No pneumothorax.  Biapical pleural thickening is noted.  No acute osseous finding.  IMPRESSION: Trace bilateral pleural effusions and probable bilateral lower lobe atelectasis or scarring.   Original Report Authenticated By: Christiana Pellant, M.D.      1. Influenza       MDM  Patient with flulike symptoms now for 2 days. Several episodes of vomiting yesterday nausea persists today. Patient is nontoxic no acute distress. Patient feels better after a liter of normal saline in the ED and Zofran 4 mg ODT. Patient not appropriate candidate for Tamiflu symptoms of origin been present for 2 days. Also patient's symptoms are not very severe. Patient did not have the flu shot. Patient will return for any newer worse symptoms. Patient will work on oral hydration.      I  personally performed the services described in this documentation, which was scribed in my presence. The recorded information has been reviewed and is accurate.     Shelda Jakes, MD 12/31/11 [phone]

## 2011-12-31 NOTE — ED Notes (Signed)
Patient with c/o generalized body aches x 2 days. +n/v.

## 2011-12-31 NOTE — ED Notes (Signed)
MD at bedside. 

## 2012-01-05 ENCOUNTER — Ambulatory Visit (HOSPITAL_COMMUNITY)
Admission: RE | Admit: 2012-01-05 | Discharge: 2012-01-05 | Disposition: A | Discharge status: Home / Self Care | Payer: BC Managed Care – PPO | Source: Ambulatory Visit | Attending: Family Medicine | Admitting: Family Medicine

## 2012-01-05 ENCOUNTER — Other Ambulatory Visit (HOSPITAL_COMMUNITY): Payer: Self-pay | Admitting: Family Medicine

## 2012-01-05 DIAGNOSIS — J111 Influenza due to unidentified influenza virus with other respiratory manifestations: Secondary | ICD-10-CM

## 2012-01-05 DIAGNOSIS — R079 Chest pain, unspecified: Secondary | ICD-10-CM | POA: Insufficient documentation

## 2012-10-14 ENCOUNTER — Ambulatory Visit (INDEPENDENT_AMBULATORY_CARE_PROVIDER_SITE_OTHER): Payer: BC Managed Care – PPO | Admitting: Physician Assistant

## 2012-10-14 VITALS — BP 158/80 | HR 75 | Temp 98.7°F | Resp 18 | Ht 70.0 in | Wt 186.0 lb

## 2012-10-14 DIAGNOSIS — J069 Acute upper respiratory infection, unspecified: Secondary | ICD-10-CM

## 2012-10-14 MED ORDER — BENZONATATE 100 MG PO CAPS: 100.0000 mg | ORAL_CAPSULE | Freq: Three times a day (TID) | ORAL | Status: DC | PRN

## 2012-10-14 MED ORDER — IPRATROPIUM BROMIDE 0.03 % NA SOLN: 2.0000 | Freq: Two times a day (BID) | NASAL | Status: DC

## 2012-10-14 MED ORDER — HYDROCODONE-HOMATROPINE 5-1.5 MG/5ML PO SYRP: 5.0000 mL | ORAL_SOLUTION | Freq: Three times a day (TID) | ORAL | Status: DC | PRN

## 2012-10-14 MED ORDER — CETIRIZINE HCL 10 MG PO TABS: 10.0000 mg | ORAL_TABLET | Freq: Every day | ORAL | Status: DC

## 2012-10-14 NOTE — Patient Instructions (Signed)
Begin using the cetirizine (Zyrtec) once daily to help with post-nasal drainage  Use the ipratropium (Atrovent) nasal spray 2-3 times per day to help with runny nose and post-nasal drainage  Benzonatate (Tessalon Perles) every 8 hours as needed for cough  Hycodan cough syrup (or the cough syrup you have at home) at bed time - this might make you sleepy, so be careful with the first dose  Continue using the neti pot and the Mucinex.  You can also restart the Flonase as well  Drink plenty of fluids (water is best!) and get plenty of rest  If any symptoms are worsening or not improving (fever >101.70F, worsening cough, shortness of breath, wheezing) please let us know   Upper Respiratory Infection, Adult An upper respiratory infection (URI) is also sometimes known as the common cold. The upper respiratory tract includes the nose, sinuses, throat, trachea, and bronchi. Bronchi are the airways leading to the lungs. Most people improve within 1 week, but symptoms can last up to 2 weeks. A residual cough may last even longer.  CAUSES Many different viruses can infect the tissues lining the upper respiratory tract. The tissues become irritated and inflamed and often become very moist. Mucus production is also common. A cold is contagious. You can easily spread the virus to others by oral contact. This includes kissing, sharing a glass, coughing, or sneezing. Touching your mouth or nose and then touching a surface, which is then touched by another person, can also spread the virus. SYMPTOMS  Symptoms typically develop 1 to 3 days after you come in contact with a cold virus. Symptoms vary from person to person. They may include:  Runny nose.  Sneezing.  Nasal congestion.  Sinus irritation.  Sore throat.  Loss of voice (laryngitis).  Cough.  Fatigue.  Muscle aches.  Loss of appetite.  Headache.  Low-grade fever. DIAGNOSIS  You might diagnose your own cold based on familiar symptoms,  since most people get a cold 2 to 3 times a year. Your caregiver can confirm this based on your exam. Most importantly, your caregiver can check that your symptoms are not due to another disease such as strep throat, sinusitis, pneumonia, asthma, or epiglottitis. Blood tests, throat tests, and X-rays are not necessary to diagnose a common cold, but they may sometimes be helpful in excluding other more serious diseases. Your caregiver will decide if any further tests are required. RISKS AND COMPLICATIONS  You may be at risk for a more severe case of the common cold if you smoke cigarettes, have chronic heart disease (such as heart failure) or lung disease (such as asthma), or if you have a weakened immune system. The very young and very old are also at risk for more serious infections. Bacterial sinusitis, middle ear infections, and bacterial pneumonia can complicate the common cold. The common cold can worsen asthma and chronic obstructive pulmonary disease (COPD). Sometimes, these complications can require emergency medical care and may be life-threatening. PREVENTION  The best way to protect against getting a cold is to practice good hygiene. Avoid oral or hand contact with people with cold symptoms. Wash your hands often if contact occurs. There is no clear evidence that vitamin C, vitamin E, echinacea, or exercise reduces the chance of developing a cold. However, it is always recommended to get plenty of rest and practice good nutrition. TREATMENT  Treatment is directed at relieving symptoms. There is no cure. Antibiotics are not effective, because the infection is caused by a virus,  not by bacteria. Treatment may include:  Increased fluid intake. Sports drinks offer valuable electrolytes, sugars, and fluids.  Breathing heated mist or steam (vaporizer or shower).  Eating chicken soup or other clear broths, and maintaining good nutrition.  Getting plenty of rest.  Using gargles or lozenges for  comfort.  Controlling fevers with ibuprofen or acetaminophen as directed by your caregiver.  Increasing usage of your inhaler if you have asthma. Zinc gel and zinc lozenges, taken in the first 24 hours of the common cold, can shorten the duration and lessen the severity of symptoms. Pain medicines may help with fever, muscle aches, and throat pain. A variety of non-prescription medicines are available to treat congestion and runny nose. Your caregiver can make recommendations and may suggest nasal or lung inhalers for other symptoms.  HOME CARE INSTRUCTIONS   Only take over-the-counter or prescription medicines for pain, discomfort, or fever as directed by your caregiver.  Use a warm mist humidifier or inhale steam from a shower to increase air moisture. This may keep secretions moist and make it easier to breathe.  Drink enough water and fluids to keep your urine clear or pale yellow.  Rest as needed.  Return to work when your temperature has returned to normal or as your caregiver advises. You may need to stay home longer to avoid infecting others. You can also use a face mask and careful hand washing to prevent spread of the virus. SEEK MEDICAL CARE IF:   After the first few days, you feel you are getting worse rather than better.  You need your caregiver's advice about medicines to control symptoms.  You develop chills, worsening shortness of breath, or brown or red sputum. These may be signs of pneumonia.  You develop yellow or brown nasal discharge or pain in the face, especially when you bend forward. These may be signs of sinusitis.  You develop a fever, swollen neck glands, pain with swallowing, or white areas in the back of your throat. These may be signs of strep throat. SEEK IMMEDIATE MEDICAL CARE IF:   You have a fever.  You develop severe or persistent headache, ear pain, sinus pain, or chest pain.  You develop wheezing, a prolonged cough, cough up blood, or have a  change in your usual mucus (if you have chronic lung disease).  You develop sore muscles or a stiff neck. Document Released: 06/18/2000 Document Revised: 03/17/2011 Document Reviewed: 04/26/2010 [facility] Patient Information 2014 [place], [place].

## 2012-10-14 NOTE — Progress Notes (Signed)
  Subjective:    Patient ID: Stephen Kramer, male    DOB: [birth year], 62 y.o.   MRN: [id number]  HPI   Stephen Kramer is a very pleasant 62 yr old male here with concern for illness.  Reports about 8 days of cold symptoms - sinus drainage, congestion, scratchy throat, cough.  Wife had been sick with similar symptoms and passed it on to him.  She was started on levaquin, but he does not feel that his symptoms have been as bad.  Cough and ST are most bothersome symptoms.  He is coughing up green sputum.  Actually had some blood tinged sputum at one point but thinks just from coughing so hard.  No fever or chills.  Symptoms do seem to be improving.  No SOB or wheezing.  No asthma.  No smoking.  Cough is worse at night, keeps awake.  Has used Mucinex, Airborne, and neti pot for symptoms.     Review of Systems  Constitutional: Negative for fever and chills.  HENT: Positive for congestion, postnasal drip, rhinorrhea and sore throat. Negative for ear pain.   Respiratory: Positive for cough. Negative for shortness of breath and wheezing.   Cardiovascular: Negative.   Gastrointestinal: Negative.   Musculoskeletal: Negative.   Skin: Negative.   Neurological: Negative.        Objective:   Physical Exam  Vitals reviewed. Constitutional: He is oriented to person, place, and time. He appears well-developed and well-nourished. No distress.  HENT:  Head: Normocephalic and atraumatic.  Right Ear: Tympanic membrane and ear canal normal.  Left Ear: Tympanic membrane and ear canal normal.  Nose: Mucosal edema and rhinorrhea present. Right sinus exhibits no maxillary sinus tenderness and no frontal sinus tenderness. Left sinus exhibits no maxillary sinus tenderness and no frontal sinus tenderness.  Mouth/Throat: Uvula is midline, oropharynx is clear and moist and mucous membranes are normal.  Eyes: Conjunctivae are normal. No scleral icterus.  Neck: Neck supple.  Cardiovascular: Normal rate, regular rhythm  and normal heart sounds.   Pulmonary/Chest: Effort normal and breath sounds normal. He has no wheezes. He has no rales.  Lymphadenopathy:    He has no cervical adenopathy.  Neurological: He is alert and oriented to person, place, and time.  Skin: Skin is warm and dry.  Psychiatric: He has a normal mood and affect. His behavior is normal.        Assessment & Plan:  Viral URI with cough - Plan: cetirizine (ZYRTEC) 10 MG tablet, ipratropium (ATROVENT) 0.03 % nasal spray, benzonatate (TESSALON) 100 MG capsule, HYDROcodone-homatropine (HYCODAN) 5-1.5 MG/5ML syrup   Stephen Kramer is a very pleasant 62 yr old male here with 1 wk of URI symptoms.  Afebrile, VSS, lungs CTA, throat clear.  He actually feels that his symptoms are improving somewhat, but cough is nagging.  Will treat symptoms with zyrtec, atrovent, tessalon, hycodan.  Ibuprofen for sore throat.  Push fluids, rest.  Continue mucinex and neti pot.  Discuss RTC precautions such as fever, worsening cough, SOB, wheezing, change in symptoms.  Pt understands and agrees with this plan.

## 2013-03-09 ENCOUNTER — Other Ambulatory Visit: Payer: Self-pay | Admitting: Dermatology

## 2014-02-10 ENCOUNTER — Emergency Department (HOSPITAL_COMMUNITY): Payer: BLUE CROSS/BLUE SHIELD

## 2014-02-10 ENCOUNTER — Encounter (HOSPITAL_COMMUNITY): Payer: Self-pay | Admitting: Emergency Medicine

## 2014-02-10 ENCOUNTER — Inpatient Hospital Stay (HOSPITAL_COMMUNITY)
Admission: EM | Admit: 2014-02-10 | Discharge: 2014-02-12 | DRG: 311 | Disposition: A | Discharge status: Home / Self Care | Payer: BLUE CROSS/BLUE SHIELD | Attending: Internal Medicine | Admitting: Internal Medicine

## 2014-02-10 DIAGNOSIS — R079 Chest pain, unspecified: Secondary | ICD-10-CM | POA: Diagnosis present

## 2014-02-10 DIAGNOSIS — M1990 Unspecified osteoarthritis, unspecified site: Secondary | ICD-10-CM | POA: Diagnosis present

## 2014-02-10 DIAGNOSIS — Z888 Allergy status to other drugs, medicaments and biological substances status: Secondary | ICD-10-CM

## 2014-02-10 DIAGNOSIS — D649 Anemia, unspecified: Secondary | ICD-10-CM | POA: Diagnosis present

## 2014-02-10 DIAGNOSIS — N411 Chronic prostatitis: Secondary | ICD-10-CM | POA: Diagnosis present

## 2014-02-10 DIAGNOSIS — R001 Bradycardia, unspecified: Secondary | ICD-10-CM | POA: Diagnosis present

## 2014-02-10 DIAGNOSIS — T447X5A Adverse effect of beta-adrenoreceptor antagonists, initial encounter: Secondary | ICD-10-CM | POA: Diagnosis present

## 2014-02-10 DIAGNOSIS — I200 Unstable angina: Secondary | ICD-10-CM | POA: Diagnosis not present

## 2014-02-10 HISTORY — DX: Chest pain, unspecified: R07.9

## 2014-02-10 LAB — COMPREHENSIVE METABOLIC PANEL
ALT: 26 U/L (ref 0–53)
AST: 24 U/L (ref 0–37)
Albumin: 3.9 g/dL (ref 3.5–5.2)
Alkaline Phosphatase: 60 U/L (ref 39–117)
Anion gap: 0 — ABNORMAL LOW (ref 5–15)
BUN: 12 mg/dL (ref 6–23)
CO2: 28 mmol/L (ref 19–32)
Calcium: 8.5 mg/dL (ref 8.4–10.5)
Chloride: 112 mmol/L (ref 96–112)
Creatinine, Ser: 0.82 mg/dL (ref 0.50–1.35)
GFR calc Af Amer: >90 mL/min (ref >90)
GFR calc non Af Amer: >90 mL/min (ref >90)
Glucose, Bld: 103 mg/dL — ABNORMAL HIGH (ref 70–99)
Potassium: 3.7 mmol/L (ref 3.5–5.1)
Sodium: 139 mmol/L (ref 135–145)
Total Bilirubin: 0.9 mg/dL (ref 0.3–1.2)
Total Protein: 6.9 g/dL (ref 6.0–8.3)

## 2014-02-10 LAB — CBC WITH DIFFERENTIAL/PLATELET
Basophils Absolute: 0 10*3/uL (ref 0.0–0.1)
Basophils Relative: 1 % (ref 0–1)
Eosinophils Absolute: 0.3 10*3/uL (ref 0.0–0.7)
Eosinophils Relative: 4 % (ref 0–5)
HCT: 42 % (ref 39.0–52.0)
Hemoglobin: 13.2 g/dL (ref 13.0–17.0)
Lymphocytes Relative: 56 % — ABNORMAL HIGH (ref 12–46)
Lymphs Abs: 3.8 10*3/uL (ref 0.7–4.0)
MCH: 28.4 pg (ref 26.0–34.0)
MCHC: 31.4 g/dL (ref 30.0–36.0)
MCV: 90.5 fL (ref 78.0–100.0)
Monocytes Absolute: 0.5 10*3/uL (ref 0.1–1.0)
Monocytes Relative: 8 % (ref 3–12)
Neutro Abs: 2.1 10*3/uL (ref 1.7–7.7)
Neutrophils Relative %: 31 % — ABNORMAL LOW (ref 43–77)
Platelets: 177 10*3/uL (ref 150–400)
RBC: 4.64 MIL/uL (ref 4.22–5.81)
RDW: 13.9 % (ref 11.5–15.5)
WBC: 6.7 10*3/uL (ref 4.0–10.5)

## 2014-02-10 LAB — TROPONIN I: Troponin I: <0.03 ng/mL (ref <0.031)

## 2014-02-10 MED ORDER — ASPIRIN 81 MG PO CHEW
324.0000 mg | CHEWABLE_TABLET | Freq: Once | ORAL | Status: AC
  Administered 2014-02-10: 324 mg via ORAL
  Filled 2014-02-10: qty 4

## 2014-02-10 MED ORDER — NITROGLYCERIN 0.4 MG SL SUBL
0.4000 mg | SUBLINGUAL_TABLET | SUBLINGUAL | Status: AC | PRN
  Administered 2014-02-10 (×3): 0.4 mg via SUBLINGUAL
  Filled 2014-02-10: qty 1

## 2014-02-10 MED ORDER — NITROGLYCERIN IN D5W 200-5 MCG/ML-% IV SOLN
10.0000 ug/min | INTRAVENOUS | Status: DC
  Administered 2014-02-10: 10 ug/min via INTRAVENOUS
  Filled 2014-02-10: qty 250

## 2014-02-10 MED ORDER — HEPARIN (PORCINE) IN NACL 100-0.45 UNIT/ML-% IJ SOLN
1250.0000 [IU]/h | INTRAMUSCULAR | Status: DC
  Administered 2014-02-11: 1050 [IU]/h via INTRAVENOUS
  Filled 2014-02-10 (×2): qty 250

## 2014-02-10 MED ORDER — HEPARIN BOLUS VIA INFUSION
4000.0000 [IU] | Freq: Once | INTRAVENOUS | Status: AC
  Administered 2014-02-11: 4000 [IU] via INTRAVENOUS

## 2014-02-10 NOTE — ED Notes (Signed)
Patient reports chest pain and chest tightness for 1 week. Denies other symptoms.

## 2014-02-10 NOTE — ED Provider Notes (Addendum)
CSN: [id number]     Arrival date & time 02/10/14  2150 History  This chart was scribed for Delora Fuel, MD by Chester Holstein, ED Scribe. This patient was seen in room APA18/APA18 and the patient's care was started at 11:01 PM.    Chief Complaint  Patient presents with  . Chest Pain    Patient is a 64 y.o. male presenting with chest pain. The history is provided by the patient. No language interpreter was used.  Chest Pain Associated symptoms: shortness of breath   Associated symptoms: no diaphoresis and no nausea    HPI Comments: ORSON RHO is a 64 y.o. male with h/o chronic prostatitis, nocturia, and anemia who presents to the Emergency Department complaining of sharp intermittent 5/10 chest pain with first onset a week ago. Pt notes chest pain usually lasts a few minutes. Pt notes associated SOB.  He states exertion exacerbates the pain, and notes it takes less exertion to aggravate pain now than last week. Pt is currently experiencing 4/10 chest pressure.  Pt has been taking baby ASA for relief, last taken this afternoon. Pt is not a smoker. Pt denies significant cardiac FHx, pain disturbing his sleep, diaphoresis and nausea.    Past Medical History  Diagnosis Date  . Chronic prostatitis   . Nocturia   . Anemia    Past Surgical History  Procedure Laterality Date  . Colonoscopy     Family History  Problem Relation Age of Onset  . Colon cancer Brother   . Hypertension Mother   . Diabetes Mother   . Hypertension Father    History  Substance Use Topics  . Smoking status: Never Smoker   . Smokeless tobacco: Never Used  . Alcohol Use: 2.4 oz/week    4 Cans of beer per week    Review of Systems  Constitutional: Negative for diaphoresis.  Respiratory: Positive for shortness of breath.   Cardiovascular: Positive for chest pain.  Gastrointestinal: Negative for nausea.  All other systems reviewed and are negative.     Allergies  Feldene  Home Medications   Prior  to Admission medications   Medication Sig Start Date End Date Taking? Authorizing Provider  amLODipine (NORVASC) 5 MG tablet Take 5 mg by mouth daily. 12/11/13  Yes Historical Provider, MD  celecoxib (CELEBREX) 200 MG capsule Take 200 mg by mouth 2 (two) times daily.   Yes Historical Provider, MD  cetirizine (ZYRTEC) 10 MG tablet Take 1 tablet (10 mg total) by mouth daily. 10/14/12  Yes Eleanore E Egan, PA-C  ipratropium (ATROVENT) 0.03 % nasal spray Place 2 sprays into the nose 2 (two) times daily. Patient taking differently: Place 2 sprays into the nose 2 (two) times daily as needed for rhinitis.  10/14/12  Yes Eleanore Kurtis Bushman, PA-C  ketoconazole (NIZORAL) 2 % cream Apply 1 application topically 2 (two) times daily. 01/07/14  Yes Historical Provider, MD  benzonatate (TESSALON) 100 MG capsule Take 1-2 capsules (100-200 mg total) by mouth 3 (three) times daily as needed for cough. Patient not taking: Reported on 02/10/2014 10/14/12   Theda Sers, PA-C  HYDROcodone-homatropine [facility]) 5-1.5 MG/5ML syrup Take 5 mLs by mouth every 8 (eight) hours as needed for cough. Patient not taking: Reported on 02/10/2014 10/14/12   Theda Sers, PA-C   BP 180/76 mmHg  Pulse 63  Temp(Src) 98.9 F (37.2 C) (Oral)  Resp 20  Ht 5\' 10"  (1.778 m)  Wt 195 lb (88.451 kg)  BMI 27.98 kg/m2  SpO2 99% Physical Exam  Constitutional: He is oriented to person, place, and time. He appears well-developed and well-nourished.  HENT:  Head: Normocephalic.  Eyes: Conjunctivae are normal. Pupils are equal, round, and reactive to light.  Neck: Normal range of motion. Neck supple. No JVD present.  Cardiovascular: Normal rate, regular rhythm, normal heart sounds and intact distal pulses.  Exam reveals no friction rub.   No murmur heard. Pulmonary/Chest: Effort normal and breath sounds normal. No respiratory distress. He has no wheezes. He has no rales. He exhibits no tenderness.  Abdominal: Soft. Bowel sounds are normal. He  exhibits no distension and no mass. There is no tenderness.  Small umbilical hernia easily reducible.  Musculoskeletal: Normal range of motion. He exhibits no edema.  Lymphadenopathy:    He has no cervical adenopathy.  Neurological: He is alert and oriented to person, place, and time. No cranial nerve deficit. Coordination normal.  Skin: Skin is warm and dry. No rash noted.  Psychiatric: He has a normal mood and affect. His behavior is normal. Thought content normal.  Nursing note and vitals reviewed.   ED Course  Procedures (including critical care time) DIAGNOSTIC STUDIES: Oxygen Saturation is 99% on room air, normal by my interpretation.    COORDINATION OF CARE: 11:13 PM Discussed treatment plan with patient at beside, the patient agrees with the plan and has no further questions at this time.   Labs Review Results for orders placed or performed during the hospital encounter of 02/10/14  CBC with Differential  Result Value Ref Range   WBC 6.7 4.0 - 10.5 K/uL   RBC 4.64 4.22 - 5.81 MIL/uL   Hemoglobin 13.2 13.0 - 17.0 g/dL   HCT 42.0 39.0 - 52.0 %   MCV 90.5 78.0 - 100.0 fL   MCH 28.4 26.0 - 34.0 pg   MCHC 31.4 30.0 - 36.0 g/dL   RDW 13.9 11.5 - 15.5 %   Platelets 177 150 - 400 K/uL   Neutrophils Relative % 31 (L) 43 - 77 %   Neutro Abs 2.1 1.7 - 7.7 K/uL   Lymphocytes Relative 56 (H) 12 - 46 %   Lymphs Abs 3.8 0.7 - 4.0 K/uL   Monocytes Relative 8 3 - 12 %   Monocytes Absolute 0.5 0.1 - 1.0 K/uL   Eosinophils Relative 4 0 - 5 %   Eosinophils Absolute 0.3 0.0 - 0.7 K/uL   Basophils Relative 1 0 - 1 %   Basophils Absolute 0.0 0.0 - 0.1 K/uL  Comprehensive metabolic panel  Result Value Ref Range   Sodium 139 135 - 145 mmol/L   Potassium 3.7 3.5 - 5.1 mmol/L   Chloride 112 96 - 112 mmol/L   CO2 28 19 - 32 mmol/L   Glucose, Bld 103 (H) 70 - 99 mg/dL   BUN 12 6 - 23 mg/dL   Creatinine, Ser 0.82 0.50 - 1.35 mg/dL   Calcium 8.5 8.4 - 10.5 mg/dL   Total Protein 6.9 6.0 -  8.3 g/dL   Albumin 3.9 3.5 - 5.2 g/dL   AST 24 0 - 37 U/L   ALT 26 0 - 53 U/L   Alkaline Phosphatase 60 39 - 117 U/L   Total Bilirubin 0.9 0.3 - 1.2 mg/dL   GFR calc non Af Amer >90 >90 mL/min   GFR calc Af Amer >90 >90 mL/min   Anion gap 0.0 (L) 5 - 15  Troponin I  Result Value Ref Range   Troponin I <0.03 <  0.031 ng/mL    Imaging Review Dg Chest 2 View  02/10/2014   CLINICAL DATA:  Mid chest pain for 1 week.  EXAM: CHEST  2 VIEW  COMPARISON:  01/05/2012  FINDINGS: There is mild hyperinflation. Biapical pleural parenchymal scarring appear similar to prior exam. The heart is normal in size, there is mild tortuosity of the thoracic aorta. Mild atelectasis posteriorly at the lung bases. The lungs are otherwise clear. Pulmonary vasculature is normal. No consolidation, pleural effusion, or pneumothorax. No acute osseous abnormalities are seen.  IMPRESSION: Mild hyperinflation, may reflect bronchitis. There is no localizing pulmonary process.   Electronically Signed   By: Jeb Levering M.D.   On: 02/10/2014 22:54     EKG Interpretation   Date/Time:  Friday February 10 2014 22:02:51 EST Ventricular Rate:  62 PR Interval:  147 QRS Duration: 83 QT Interval:  412 QTC Calculation: 418 R Axis:   12 Text Interpretation:  Sinus rhythm Borderline T abnormalities, inferior  leads Baseline wander in lead(s) V6 No old tracing to compare Confirmed by  [facility]  MD, Eloy Fehl ([id number]) on 02/10/2014 10:59:04 PM      MDM   Final diagnoses:  Unstable angina    Unstable angina based on history. Significant cardiac risk factors are hypertension and family history of coronary artery disease with mother having had coronary disease with onset at about his current age. He is given aspirin and nitroglycerin with partial relief of pain. He is placed on heparin and nitroglycerin drips. Case is discussed with Dr. Kennith Center of cardiology service at [facility] who feels the patient should be admitted to the  hospitalist service with cardiology consultation. Case is discussed with Dr. Shanon Brow of [facility] agrees to come to admit the patient. He will need to be transferred to [facility] for cardiology care.  I personally performed the services described in this documentation, which was scribed in my presence. The recorded information has been reviewed and is accurate.       Delora Fuel, MD 61/60/73 [id number]  He continued to have chest pain in spite of nitroglycerin so was given morphine and eventually became pain-free. Pain recurred so was given additional morphine. ECG is repeated and showing no change from prior ECG. Repeat troponin is pending.   EKG Interpretation   Date/Time:  Saturday February 11 2014 03:30:13 EST Ventricular Rate:  51 PR Interval:  153 QRS Duration: 83 QT Interval:  441 QTC Calculation: 406 R Axis:   25 Text Interpretation:  Sinus rhythm Nonspecific T abnormalities, inferior  leads When compared with ECG of 02/10/2014, No significant change was found  Confirmed by [facility]  MD, Alahni Varone ([id number]) on 02/11/2014 3:41:46 AM        Delora Fuel, MD 54/62/70 [id number]  Jasiel Belisle, MD 93/81/82 [id number]

## 2014-02-10 NOTE — Progress Notes (Signed)
ANTICOAGULATION CONSULT NOTE - Initial Consult  Pharmacy Consult for Heparin   Indication: chest pain/ACS  Allergies  Allergen Reactions  . Feldene [Piroxicam] Other (See Comments)    Diarrhea    Patient Measurements: Height: 5\' 10"  (177.8 cm) Weight: 195 lb (88.451 kg) IBW/kg (Calculated) : 73  Vital Signs: Temp: 98.9 F (37.2 C) (02/05 2153) Temp Source: Oral (02/05 2153) BP: 180/76 mmHg (02/05 2153) Pulse Rate: 63 (02/05 2153)  Labs:  Recent Labs  02/10/14 2208  HGB 13.2  HCT 42.0  PLT 177  CREATININE 0.82  TROPONINI <0.03    Estimated Creatinine Clearance: 102 mL/min (by C-G formula based on Cr of 0.82).   Medical History: Past Medical History  Diagnosis Date  . Chronic prostatitis   . Nocturia   . Anemia     Medications:  Scheduled:  . aspirin  324 mg Oral Once    Assessment: 89 yoM who presents to ED with chest pain.  EKG +T wave abnormalities. Troponins wnl. CBC reviewed.  No bleeding noted.   Goal of Therapy:  Heparin level 0.3-0.7 units/ml Monitor platelets by anticoagulation protocol: Yes   Plan:  Give 4000 units bolus x 1 Start heparin infusion at 1050 units/hr Check anti-Xa level in 6 hours and daily while on heparin Continue to monitor H&H and platelets  Elaine Middleton, Lavonia Drafts 02/10/2014,11:23 PM

## 2014-02-11 DIAGNOSIS — N411 Chronic prostatitis: Secondary | ICD-10-CM | POA: Diagnosis present

## 2014-02-11 DIAGNOSIS — M158 Other polyosteoarthritis: Secondary | ICD-10-CM

## 2014-02-11 DIAGNOSIS — R079 Chest pain, unspecified: Secondary | ICD-10-CM | POA: Diagnosis present

## 2014-02-11 DIAGNOSIS — M1990 Unspecified osteoarthritis, unspecified site: Secondary | ICD-10-CM | POA: Diagnosis present

## 2014-02-11 DIAGNOSIS — I200 Unstable angina: Secondary | ICD-10-CM | POA: Diagnosis present

## 2014-02-11 DIAGNOSIS — R001 Bradycardia, unspecified: Secondary | ICD-10-CM | POA: Diagnosis present

## 2014-02-11 DIAGNOSIS — T447X5A Adverse effect of beta-adrenoreceptor antagonists, initial encounter: Secondary | ICD-10-CM | POA: Diagnosis present

## 2014-02-11 DIAGNOSIS — D649 Anemia, unspecified: Secondary | ICD-10-CM | POA: Diagnosis present

## 2014-02-11 DIAGNOSIS — Z888 Allergy status to other drugs, medicaments and biological substances status: Secondary | ICD-10-CM | POA: Diagnosis not present

## 2014-02-11 LAB — LIPID PANEL
CHOL/HDL RATIO: 3 ratio
CHOLESTEROL: 116 mg/dL (ref 0–200)
HDL: 39 mg/dL — ABNORMAL LOW (ref >39)
LDL CALC: 67 mg/dL (ref 0–99)
TRIGLYCERIDES: 49 mg/dL (ref <150)
VLDL: 10 mg/dL (ref 0–40)

## 2014-02-11 LAB — URINALYSIS, ROUTINE W REFLEX MICROSCOPIC
Bilirubin Urine: NEGATIVE
GLUCOSE, UA: 100 mg/dL — AB
HGB URINE DIPSTICK: NEGATIVE
KETONES UR: NEGATIVE mg/dL
LEUKOCYTES UA: NEGATIVE
Nitrite: NEGATIVE
PROTEIN: NEGATIVE mg/dL
SPECIFIC GRAVITY, URINE: 1.019 (ref 1.005–1.030)
UROBILINOGEN UA: 0.2 mg/dL (ref 0.0–1.0)
pH: 6.5 (ref 5.0–8.0)

## 2014-02-11 LAB — CBC
HCT: 38.8 % — ABNORMAL LOW (ref 39.0–52.0)
HEMATOCRIT: 37.1 % — AB (ref 39.0–52.0)
HEMOGLOBIN: 11.8 g/dL — AB (ref 13.0–17.0)
Hemoglobin: 12.2 g/dL — ABNORMAL LOW (ref 13.0–17.0)
MCH: 28.8 pg (ref 26.0–34.0)
MCH: 28.7 pg (ref 26.0–34.0)
MCHC: 31.8 g/dL (ref 30.0–36.0)
MCHC: 31.4 g/dL (ref 30.0–36.0)
MCV: 90.5 fL (ref 78.0–100.0)
MCV: 91.3 fL (ref 78.0–100.0)
Platelets: 144 10*3/uL — ABNORMAL LOW (ref 150–400)
Platelets: 152 10*3/uL (ref 150–400)
RBC: 4.1 MIL/uL — AB (ref 4.22–5.81)
RBC: 4.25 MIL/uL (ref 4.22–5.81)
RDW: 13.7 % (ref 11.5–15.5)
RDW: 14 % (ref 11.5–15.5)
WBC: 5.5 10*3/uL (ref 4.0–10.5)
WBC: 5.9 10*3/uL (ref 4.0–10.5)

## 2014-02-11 LAB — BASIC METABOLIC PANEL
ANION GAP: 1 — AB (ref 5–15)
BUN: 11 mg/dL (ref 6–23)
CALCIUM: 7.9 mg/dL — AB (ref 8.4–10.5)
CHLORIDE: 113 mmol/L — AB (ref 96–112)
CO2: 26 mmol/L (ref 19–32)
CREATININE: 0.75 mg/dL (ref 0.50–1.35)
GLUCOSE: 85 mg/dL (ref 70–99)
POTASSIUM: 3.7 mmol/L (ref 3.5–5.1)
Sodium: 140 mmol/L (ref 135–145)

## 2014-02-11 LAB — HEPARIN LEVEL (UNFRACTIONATED): Heparin Unfractionated: 0.27 IU/mL — ABNORMAL LOW (ref 0.30–0.70)

## 2014-02-11 LAB — TROPONIN I
Troponin I: <0.03 ng/mL (ref <0.031)
Troponin I: <0.03 ng/mL (ref <0.031)

## 2014-02-11 LAB — PROTIME-INR
INR: 1.03 (ref 0.00–1.49)
PROTHROMBIN TIME: 13.6 s (ref 11.6–15.2)

## 2014-02-11 LAB — MRSA PCR SCREENING: MRSA BY PCR: NEGATIVE

## 2014-02-11 MED ORDER — MORPHINE SULFATE 4 MG/ML IJ SOLN
4.0000 mg | Freq: Once | INTRAMUSCULAR | Status: AC
  Administered 2014-02-11: 4 mg via INTRAVENOUS
  Filled 2014-02-11: qty 1

## 2014-02-11 MED ORDER — ONDANSETRON HCL 4 MG/2ML IJ SOLN
4.0000 mg | Freq: Four times a day (QID) | INTRAMUSCULAR | Status: DC | PRN
  Administered 2014-02-11: 4 mg via INTRAVENOUS
  Filled 2014-02-11: qty 2

## 2014-02-11 MED ORDER — MORPHINE SULFATE 2 MG/ML IJ SOLN
2.0000 mg | Freq: Once | INTRAMUSCULAR | Status: AC
  Administered 2014-02-11: 2 mg via INTRAVENOUS
  Filled 2014-02-11: qty 1

## 2014-02-11 MED ORDER — SODIUM CHLORIDE 0.9 % IJ SOLN
3.0000 mL | Freq: Two times a day (BID) | INTRAMUSCULAR | Status: DC
  Administered 2014-02-11: 3 mL via INTRAVENOUS

## 2014-02-11 MED ORDER — AMITRIPTYLINE HCL 10 MG PO TABS
10.0000 mg | ORAL_TABLET | Freq: Every day | ORAL | Status: DC
  Administered 2014-02-11: 10 mg via ORAL
  Filled 2014-02-11 (×2): qty 1

## 2014-02-11 MED ORDER — SODIUM CHLORIDE 0.9 % IV SOLN: 250.0000 mL | INTRAVENOUS | Status: DC | PRN

## 2014-02-11 MED ORDER — ASPIRIN EC 81 MG PO TBEC
81.0000 mg | DELAYED_RELEASE_TABLET | Freq: Every day | ORAL | Status: DC
  Filled 2014-02-11: qty 1

## 2014-02-11 MED ORDER — METOPROLOL TARTRATE 25 MG PO TABS
25.0000 mg | ORAL_TABLET | Freq: Two times a day (BID) | ORAL | Status: DC
  Filled 2014-02-11: qty 1

## 2014-02-11 MED ORDER — MORPHINE SULFATE 4 MG/ML IJ SOLN
4.0000 mg | INTRAMUSCULAR | Status: AC | PRN
  Administered 2014-02-11 (×3): 4 mg via INTRAVENOUS
  Filled 2014-02-11 (×3): qty 1

## 2014-02-11 MED ORDER — ATORVASTATIN CALCIUM 20 MG PO TABS
20.0000 mg | ORAL_TABLET | Freq: Every day | ORAL | Status: DC
  Filled 2014-02-11 (×2): qty 1

## 2014-02-11 MED ORDER — HYDROCODONE-ACETAMINOPHEN 5-325 MG PO TABS
1.0000 | ORAL_TABLET | Freq: Once | ORAL | Status: AC
  Administered 2014-02-11: 1 via ORAL
  Filled 2014-02-11: qty 1

## 2014-02-11 MED ORDER — SODIUM CHLORIDE 0.9 % IJ SOLN: 3.0000 mL | INTRAMUSCULAR | Status: DC | PRN

## 2014-02-11 MED ORDER — ACETAMINOPHEN 325 MG PO TABS: 650.0000 mg | ORAL_TABLET | ORAL | Status: DC | PRN

## 2014-02-11 NOTE — Progress Notes (Signed)
[facility] for Heparin   Indication: chest pain/ACS  Allergies  Allergen Reactions  . Feldene [Piroxicam] Other (See Comments)    Diarrhea    Patient Measurements: Height: 5\' 10"  (177.8 cm) Weight: 195 lb (88.451 kg) IBW/kg (Calculated) : 73  Vital Signs: Temp: 98.9 F (37.2 C) (02/05 2153) Temp Source: Oral (02/05 2153) BP: 122/74 mmHg (02/06 0830) Pulse Rate: 54 (02/06 0830)  Labs:  Recent Labs  02/10/14 2208 02/11/14 0354 02/11/14 0721  HGB 13.2 11.8* 12.2*  HCT 42.0 37.1* 38.8*  PLT 177 144* 152  LABPROT  --   --  13.6  INR  --   --  1.03  HEPARINUNFRC  --   --  0.27*  CREATININE 0.82  --  0.75  TROPONINI <0.03 <0.03 <0.03    Estimated Creatinine Clearance: 104.5 mL/min (by C-G formula based on Cr of 0.75).   Medical History: Past Medical History  Diagnosis Date  . Chronic prostatitis   . Nocturia   . Anemia     Medications:  Scheduled:  . [START ON 02/12/2014] aspirin EC  81 mg Oral Daily  . atorvastatin  20 mg Oral q1800  . metoprolol tartrate  25 mg Oral BID  . sodium chloride  3 mL Intravenous Q12H    Assessment: 28 yoM who presents to ED with chest pain.  EKG +T wave abnormalities. Troponins wnl. CBC reviewed.  No bleeding noted.  Initial heparin level slightly below goal range. Noted plan to transfer to [facility] for cardiology consult once bed available.   Goal of Therapy:  Heparin level 0.3-0.7 units/ml Monitor platelets by anticoagulation protocol: Yes   Plan:  Increase heparin infusion to 1250 units/hr Recheck 6hr heparin level Daily heparin level & CBC  Biagio Borg 02/11/2014,9:04 AM

## 2014-02-11 NOTE — Progress Notes (Signed)
Utilization Review completed.  

## 2014-02-11 NOTE — H&P (Signed)
PCP:   [name], MD   Chief Complaint:  Chest pain  HPI: 64 yo male with one week history of sscp that is worse with exertion and relieved with resting without any radiation sometimes associated with sob, no n/v.  No fevers no cough.  No pnd or orthopnea.  No le edema or swelling.  No prior cardiac w/u, but mother had cabg in her 76s.  Pt was given ntg sl on arrival to ED which totally relieved his pain.  Pain not worse with deep inspiration.  Chest pain free at this time on ntg and hep drips.  Review of Systems:  Positive and negative as per HPI otherwise all other systems are negative  Past Medical History: Past Medical History  Diagnosis Date  . Chronic prostatitis   . Nocturia   . Anemia    Past Surgical History  Procedure Laterality Date  . Colonoscopy      Medications: Prior to Admission medications   Medication Sig Start Date End Date Taking? Authorizing Provider  amLODipine (NORVASC) 5 MG tablet Take 5 mg by mouth daily. 12/11/13  Yes Historical Provider, MD  celecoxib (CELEBREX) 200 MG capsule Take 200 mg by mouth 2 (two) times daily.   Yes Historical Provider, MD  cetirizine (ZYRTEC) 10 MG tablet Take 1 tablet (10 mg total) by mouth daily. 10/14/12  Yes Eleanore E Egan, PA-C  ipratropium (ATROVENT) 0.03 % nasal spray Place 2 sprays into the nose 2 (two) times daily. Patient taking differently: Place 2 sprays into the nose 2 (two) times daily as needed for rhinitis.  10/14/12  Yes Eleanore Kurtis Bushman, PA-C  ketoconazole (NIZORAL) 2 % cream Apply 1 application topically 2 (two) times daily. 01/07/14  Yes Historical Provider, MD  benzonatate (TESSALON) 100 MG capsule Take 1-2 capsules (100-200 mg total) by mouth 3 (three) times daily as needed for cough. Patient not taking: Reported on 02/10/2014 10/14/12   Theda Sers, PA-C  HYDROcodone-homatropine [facility]) 5-1.5 MG/5ML syrup Take 5 mLs by mouth every 8 (eight) hours as needed for cough. Patient not taking: Reported  on 02/10/2014 10/14/12   Theda Sers, PA-C    Allergies:   Allergies  Allergen Reactions  . Feldene [Piroxicam] Other (See Comments)    Diarrhea    Social History:  reports that he has never smoked. He has never used smokeless tobacco. He reports that he drinks about 2.4 oz of alcohol per week. He reports that he does not use illicit drugs.  Family History: Family History  Problem Relation Age of Onset  . Colon cancer Brother   . Hypertension Mother   . Diabetes Mother   . Hypertension Father     Physical Exam: Filed Vitals:   02/10/14 2345 02/10/14 2359 02/11/14 0000 02/11/14 0015  BP:  123/79 123/79 120/72  Pulse: 59 62 58 57  Temp:      TempSrc:      Resp: 13 16 12 12   Height:      Weight:      SpO2: 96% 97% 97% 97%   General appearance: alert, cooperative and no distress Head: Normocephalic, without obvious abnormality, atraumatic Eyes: negative Nose: Nares normal. Septum midline. Mucosa normal. No drainage or sinus tenderness. Neck: no JVD and supple, symmetrical, trachea midline Lungs: clear to auscultation bilaterally Heart: regular rate and rhythm, S1, S2 normal, no murmur, click, rub or gallop Abdomen: soft, non-tender; bowel sounds normal; no masses,  no organomegaly Extremities: extremities normal, atraumatic, no cyanosis or  edema Pulses: 2+ and symmetric Skin: Skin color, texture, turgor normal. No rashes or lesions Neurologic: Grossly normal  Labs on Admission:   Recent Labs  02/10/14 2208  NA 139  K 3.7  CL 112  CO2 28  GLUCOSE 103*  BUN 12  CREATININE 0.82  CALCIUM 8.5    Recent Labs  02/10/14 2208  AST 24  ALT 26  ALKPHOS 60  BILITOT 0.9  PROT 6.9  ALBUMIN 3.9    Recent Labs  02/10/14 2208  WBC 6.7  NEUTROABS 2.1  HGB 13.2  HCT 42.0  MCV 90.5  PLT 177    Recent Labs  02/10/14 2208  TROPONINI <0.03   Radiological Exams on Admission: Dg Chest 2 View  02/10/2014   CLINICAL DATA:  Mid chest pain for 1 week.  EXAM:  CHEST  2 VIEW  COMPARISON:  01/05/2012  FINDINGS: There is mild hyperinflation. Biapical pleural parenchymal scarring appear similar to prior exam. The heart is normal in size, there is mild tortuosity of the thoracic aorta. Mild atelectasis posteriorly at the lung bases. The lungs are otherwise clear. Pulmonary vasculature is normal. No consolidation, pleural effusion, or pneumothorax. No acute osseous abnormalities are seen.  IMPRESSION: Mild hyperinflation, may reflect bronchitis. There is no localizing pulmonary process.   Electronically Signed   By: Jeb Levering M.D.   On: 02/10/2014 22:54    Assessment/Plan  64 yo male with unstable angina  Principal Problem:   Unstable angina-  Hep gtt, ntg gtt.  acs protocol.  [facility]iology at [facility] called for consult who requested [facility] to [facility] for their evaluation.  Order written to call cards team on arrival to [facility] for further w/u recommendations.  Pt currently cp free and stable.  Daily aspirin.  Has strong family history of early CAD in his mother.  Active Problems:  Stable unless o/w noted   Osteoarthritis   Anemia  obs on stepdown at [facility].  Full code.  Alif Petrak A 02/11/2014, 12:40 AM

## 2014-02-11 NOTE — ED Notes (Signed)
Pt sleeping. 

## 2014-02-11 NOTE — Progress Notes (Signed)
[facility] hospital[facility] progress note. Chief complaint. Chest pressure. History of present illness. This 64 year old male in hospital with complaints of chest pain. He was initially placed on heparin and nitroglycerin however these were discontinued when patient ruled out with normal troponins 3. He now complains of chest pressure. A request nursing obtain a 12-lead EKG. This has resulted and shows sinus bradycardia without evidence of acute ischemia. I came up to evaluate the patient at bedside. He describes a sensation of chest tightness over the central chest. There is no associated nausea, diaphoresis, or radiation. Vital signs. Temperature 98.5, pulse 65, respiration 16, blood pressure 118/63. O2 sats 96%. General appearance. Well-developed white male who is alert and in no distress. Cardiac. Rate and rhythm regular. Lungs. Breath sounds clear and equal. Abdomen. Soft with positive bowel sounds. Impression/plan. Problem #1. Chest pressure. Current EKG does not suggest acute ischemia. Nonetheless we'll obtain an additional troponin to evaluate for any abnormality.

## 2014-02-11 NOTE — ED Notes (Signed)
edp aware of pt's pain level and blood pressure. Orders received

## 2014-02-11 NOTE — Progress Notes (Signed)
Patient admitted after midnight. Cart reviewed. patient examined. Transfer from [facility] ED. No chest pain currently. MI ruled out. D/c heparin gtt and nitro gtt.. D/c metoprolol due to bradycardia. D/w Dr. Aundra Dubin. He recommends myoview tomorrow, as MI ruled out. Updated wife.   Stephen Barthel MD [facility] [email or web address]

## 2014-02-12 ENCOUNTER — Encounter (HOSPITAL_COMMUNITY): Payer: Self-pay | Admitting: Nurse Practitioner

## 2014-02-12 ENCOUNTER — Inpatient Hospital Stay (HOSPITAL_COMMUNITY): Payer: BLUE CROSS/BLUE SHIELD

## 2014-02-12 DIAGNOSIS — R079 Chest pain, unspecified: Secondary | ICD-10-CM

## 2014-02-12 LAB — CBC
HCT: 38.1 % — ABNORMAL LOW (ref 39.0–52.0)
Hemoglobin: 12.4 g/dL — ABNORMAL LOW (ref 13.0–17.0)
MCH: 28.8 pg (ref 26.0–34.0)
MCHC: 32.5 g/dL (ref 30.0–36.0)
MCV: 88.4 fL (ref 78.0–100.0)
Platelets: 165 10*3/uL (ref 150–400)
RBC: 4.31 MIL/uL (ref 4.22–5.81)
RDW: 13.9 % (ref 11.5–15.5)
WBC: 8.6 10*3/uL (ref 4.0–10.5)

## 2014-02-12 LAB — TROPONIN I: Troponin I: <0.03 ng/mL (ref <0.031)

## 2014-02-12 MED ORDER — AMITRIPTYLINE HCL 10 MG PO TABS: 10.0000 mg | ORAL_TABLET | Freq: Every day | ORAL | Status: DC

## 2014-02-12 MED ORDER — TECHNETIUM TC 99M SESTAMIBI - CARDIOLITE
30.0000 | Freq: Once | INTRAVENOUS | Status: AC | PRN
  Administered 2014-02-12: 12:00:00 30 via INTRAVENOUS

## 2014-02-12 MED ORDER — PANTOPRAZOLE SODIUM 40 MG PO TBEC: 40.0000 mg | DELAYED_RELEASE_TABLET | Freq: Every day | ORAL | Status: DC

## 2014-02-12 MED ORDER — TECHNETIUM TC 99M SESTAMIBI GENERIC - CARDIOLITE
10.0000 | Freq: Once | INTRAVENOUS | Status: AC | PRN
  Administered 2014-02-12: 10 via INTRAVENOUS

## 2014-02-12 NOTE — Discharge Summary (Signed)
Physician Discharge Summary  Stephen Kramer CBJ:[id number] DOB: January 02, 1951 DOA: 02/10/2014  PCP: [name], MD  Admit date: 02/10/2014 Discharge date: 02/12/2014  Time spent: greater than 30 minutes  Recommendations for Outpatient Follow-up:  1.   Discharge Diagnoses:  Principal Problem:   Chest pain, suspect GI etiology Active Problems: Chronic prostadinia   Anemia   Discharge Condition: stable  Filed Weights   02/10/14 2153 02/11/14 1208 02/11/14 1600  Weight: 88.451 kg (195 lb) 88.451 kg (195 lb) 87.998 kg (194 lb)    History of present illness:  64 yo male with one week history of sscp that is worse with exertion and relieved with resting without any radiation sometimes associated with sob, no n/v. No fevers no cough. No pnd or orthopnea. No le edema or swelling. No prior cardiac w/u, but mother had cabg in her 46s. Pt was given ntg sl on arrival to ED which totally relieved his pain. Pain not worse with deep inspiration. Chest pain free at this time on ntg and hep drips.  Presents to [facility] Course:  EKG with  Nonspecific changes and troponin in ED negative.  Patient started on heparin gtt and nitro gtt in ED.  EDP discussed with [facility] who recommended transfer to [facility].  MI ruled out.  Heparin gtt and nitro gtt. Stopped. I discussed case with Dr. Aundra Dubin who recommended stress myoview.  Myoview low risk.  Patient has been on celebrex long term for chronic prostate pain (previously diagnosed with interstitial cystitis.  Chest pain may be GI in nature. Patient did c/o increase in heartburn recently. celebrex stopped. Started on empiric PPI.  Started on elavil. Patient would like to try this for chronic prostate/bladder pain. No further chest pain at discharge  Procedures:  none  Consultations:  none  Discharge Exam: Filed Vitals:   02/12/14 1147  BP:   Pulse: 160  Temp:   Resp:     General: a and o,  comfortable Cardiovascular: RRR. No CW tenderness. Respiratory: CTA  Discharge Instructions   Discharge Instructions    Activity as tolerated - No restrictions    Complete by:  As directed      Diet - low sodium heart healthy    Complete by:  As directed           Current Discharge Medication List    START taking these medications   Details  amitriptyline (ELAVIL) 10 MG tablet Take 1 tablet (10 mg total) by mouth at bedtime. Qty: 30 tablet, Refills: 0    pantoprazole (PROTONIX) 40 MG tablet Take 1 tablet (40 mg total) by mouth daily. Qty: 30 tablet, Refills: 0      CONTINUE these medications which have NOT CHANGED   Details  amLODipine (NORVASC) 5 MG tablet Take 5 mg by mouth daily. Refills: 5    cetirizine (ZYRTEC) 10 MG tablet Take 1 tablet (10 mg total) by mouth daily. Qty: 30 tablet, Refills: 11   Associated Diagnoses: Viral URI with cough    ipratropium (ATROVENT) 0.03 % nasal spray Place 2 sprays into the nose 2 (two) times daily. Qty: 30 mL, Refills: 1   Associated Diagnoses: Viral URI with cough    ketoconazole (NIZORAL) 2 % cream Apply 1 application topically 2 (two) times daily. Refills: 2      STOP taking these medications     celecoxib (CELEBREX) 200 MG capsule      benzonatate (TESSALON) 100 MG capsule  HYDROcodone-homatropine (HYCODAN) 5-1.5 MG/5ML syrup        Allergies  Allergen Reactions  . Feldene [Piroxicam] Other (See Comments)    Diarrhea   Follow-up Information    Follow up with [name], MD In 4 weeks.   Specialty:  Family Medicine   Contact information:   [address] [place] [address] [phone]        The results of significant diagnostics from this hospitalization (including imaging, microbiology, ancillary and laboratory) are listed below for reference.    Significant Diagnostic Studies: Dg Chest 2 View  02/10/2014   CLINICAL DATA:  Mid chest pain for 1 week.  EXAM: CHEST  2 VIEW   COMPARISON:  01/05/2012  FINDINGS: There is mild hyperinflation. Biapical pleural parenchymal scarring appear similar to prior exam. The heart is normal in size, there is mild tortuosity of the thoracic aorta. Mild atelectasis posteriorly at the lung bases. The lungs are otherwise clear. Pulmonary vasculature is normal. No consolidation, pleural effusion, or pneumothorax. No acute osseous abnormalities are seen.  IMPRESSION: Mild hyperinflation, may reflect bronchitis. There is no localizing pulmonary process.   Electronically Signed   By: Jeb Levering M.D.   On: 02/10/2014 22:54   Nm Myocar Multi W/spect W/wall Motion / Ef  02/12/2014   CLINICAL DATA:  Chest pain  EXAM: MYOCARDIAL IMAGING WITH SPECT (REST AND EXERCISE)  GATED LEFT VENTRICULAR WALL MOTION STUDY  LEFT VENTRICULAR EJECTION FRACTION  TECHNIQUE: Standard myocardial SPECT imaging was performed after resting intravenous injection of 10 mCi Tc-49m sestamibi. Subsequently, exercise tolerance test was performed by the patient under the supervision of the Cardiology staff. At peak-stress, 30 mCi Tc-69m sestamibi was injected intravenously and standard myocardial SPECT imaging was performed. The patient was able to achieve 102% of maximal, age predicted heart rate. Quantitative gated imaging was also performed to evaluate left ventricular wall motion, and estimate left ventricular ejection fraction.  COMPARISON:  Chest radiograph - 02/10/2014  FINDINGS: Raw images: Mild patient motion artifact is seen on the provided rest images. No significant chest wall attenuation is seen on either the rest or stress images  Perfusion: No decreased activity in the left ventricle on stress imaging to suggest reversible ischemia or infarction.  Wall Motion: Normal left ventricular wall motion. No left ventricular dilation.  Left Ventricular Ejection Fraction: 67 %  End diastolic volume 903 ml  End systolic volume 39 ml  IMPRESSION: 1. No scintigraphic evidence of prior  infarction or pharmacologically ischemia.  2. Normal left ventricular wall motion.  3. Left ventricular ejection fraction 67%  4. Low-risk stress test findings*.  *2012 Appropriate Use Criteria for Coronary Revascularization Focused Update: J Am Coll Cardiol. 0092;33(0):076-226. http://content.airportbarriers.com.aspx?articleid=1201161   Electronically Signed   By: Sandi Mariscal M.D.   On: 02/12/2014 13:22    Microbiology: Recent Results (from the past 240 hour(s))  MRSA PCR Screening     Status: None   Collection Time: 02/11/14  1:42 PM  Result Value Ref Range Status   MRSA by PCR NEGATIVE NEGATIVE Final    Comment:        The GeneXpert MRSA Assay (FDA approved for NASAL specimens only), is one component of a comprehensive MRSA colonization surveillance program. It is not intended to diagnose MRSA infection nor to guide or monitor treatment for MRSA infections.      Labs: Basic Metabolic Panel:  Recent Labs Lab 02/10/14 2208 02/11/14 0721  NA 139 140  K 3.7 3.7  CL 112 113*  CO2 28 26  GLUCOSE 103* 85  BUN 12 11  CREATININE 0.82 0.75  CALCIUM 8.5 7.9*   Liver Function Tests:  Recent Labs Lab 02/10/14 2208  AST 24  ALT 26  ALKPHOS 60  BILITOT 0.9  PROT 6.9  ALBUMIN 3.9   No results for input(s): LIPASE, AMYLASE in the last 168 hours. No results for input(s): AMMONIA in the last 168 hours. CBC:  Recent Labs Lab 02/10/14 2208 02/11/14 0354 02/11/14 0721 02/12/14 0329  WBC 6.7 5.5 5.9 8.6  NEUTROABS 2.1  --   --   --   HGB 13.2 11.8* 12.2* 12.4*  HCT 42.0 37.1* 38.8* 38.1*  MCV 90.5 90.5 91.3 88.4  PLT 177 144* 152 165   Cardiac Enzymes:  Recent Labs Lab 02/10/14 2208 02/11/14 0354 02/11/14 0721 02/11/14 1138 02/12/14 0005  TROPONINI <0.03 <0.03 <0.03 <0.03 <0.03   BNP: BNP (last 3 results) No results for input(s): BNP in the last 8760 hours.  ProBNP (last 3 results) No results for input(s): PROBNP in the last 8760 hours.  CBG: No  results for input(s): GLUCAP in the last 168 hours.     SignedDelfina Redwood  [facility] 02/12/2014, 2:06 PM

## 2015-08-24 ENCOUNTER — Encounter: Payer: Self-pay | Admitting: Internal Medicine

## 2015-09-19 DIAGNOSIS — Z23 Encounter for immunization: Secondary | ICD-10-CM | POA: Diagnosis not present

## 2015-11-14 ENCOUNTER — Ambulatory Visit (HOSPITAL_COMMUNITY)
Admission: RE | Admit: 2015-11-14 | Discharge: 2015-11-14 | Disposition: A | Discharge status: Home / Self Care | Payer: Medicare Other | Source: Ambulatory Visit | Attending: Internal Medicine | Admitting: Internal Medicine

## 2015-11-14 ENCOUNTER — Other Ambulatory Visit (HOSPITAL_COMMUNITY): Payer: Self-pay | Admitting: Internal Medicine

## 2015-11-14 DIAGNOSIS — I10 Essential (primary) hypertension: Secondary | ICD-10-CM | POA: Insufficient documentation

## 2015-11-14 DIAGNOSIS — R42 Dizziness and giddiness: Secondary | ICD-10-CM | POA: Diagnosis not present

## 2015-11-14 DIAGNOSIS — Z1389 Encounter for screening for other disorder: Secondary | ICD-10-CM | POA: Diagnosis not present

## 2015-11-14 DIAGNOSIS — R51 Headache: Secondary | ICD-10-CM | POA: Diagnosis not present

## 2015-12-05 ENCOUNTER — Ambulatory Visit (INDEPENDENT_AMBULATORY_CARE_PROVIDER_SITE_OTHER): Payer: Medicare Other | Admitting: Neurology

## 2015-12-05 ENCOUNTER — Encounter: Payer: Self-pay | Admitting: Neurology

## 2015-12-05 VITALS — BP 148/91 | HR 75 | Resp 20 | Ht 70.0 in | Wt 190.0 lb

## 2015-12-05 DIAGNOSIS — E538 Deficiency of other specified B group vitamins: Secondary | ICD-10-CM | POA: Diagnosis not present

## 2015-12-05 DIAGNOSIS — G4489 Other headache syndrome: Secondary | ICD-10-CM | POA: Diagnosis not present

## 2015-12-05 MED ORDER — GABAPENTIN 100 MG PO CAPS: ORAL_CAPSULE | ORAL | 2 refills | Status: DC
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
## 2015-12-05 NOTE — Patient Instructions (Signed)
   Neurontin (gabapentin) may result in drowsiness, ankle swelling, gait instability, or possibly dizziness. Please contact our office if significant side effects occur with this medication.  

## 2015-12-06 LAB — SEDIMENTATION RATE: Sed Rate: 2 mm/hr (ref 0–30)

## 2015-12-06 LAB — VITAMIN B12: VITAMIN B 12: 371 pg/mL (ref 211–946)

## 2015-12-06 LAB — C-REACTIVE PROTEIN: CRP: 1.6 mg/L (ref 0.0–4.9)

## 2015-12-10 ENCOUNTER — Telehealth: Payer: Self-pay

## 2015-12-10 NOTE — Telephone Encounter (Signed)
Called pt w/ unremarkable lab results. Verbalized understanding and appreciation for call. Copy of lab report placed up front for pt pick-up as requested.

## 2015-12-10 NOTE — Telephone Encounter (Signed)
-----   Message from Kathrynn Ducking, MD sent at 12/06/2015  7:22 AM EST -----   The blood work results are unremarkable. Please call the patient.  ----- Message ----- From: Lavone Neri Lab Results In Sent: 12/06/2015   5:40 AM To: Kathrynn Ducking, MD

## 2015-12-13 ENCOUNTER — Ambulatory Visit: Payer: Self-pay | Admitting: Neurology

## 2016-01-22 DIAGNOSIS — K4090 Unilateral inguinal hernia, without obstruction or gangrene, not specified as recurrent: Secondary | ICD-10-CM | POA: Diagnosis not present

## 2016-01-22 DIAGNOSIS — I200 Unstable angina: Secondary | ICD-10-CM | POA: Diagnosis not present

## 2016-01-22 DIAGNOSIS — Z1389 Encounter for screening for other disorder: Secondary | ICD-10-CM | POA: Diagnosis not present

## 2016-01-22 DIAGNOSIS — Z0001 Encounter for general adult medical examination with abnormal findings: Secondary | ICD-10-CM | POA: Diagnosis not present

## 2016-01-23 DIAGNOSIS — K4090 Unilateral inguinal hernia, without obstruction or gangrene, not specified as recurrent: Secondary | ICD-10-CM | POA: Diagnosis not present

## 2016-02-15 DIAGNOSIS — K4090 Unilateral inguinal hernia, without obstruction or gangrene, not specified as recurrent: Secondary | ICD-10-CM | POA: Diagnosis not present

## 2016-02-15 DIAGNOSIS — K4000 Bilateral inguinal hernia, with obstruction, without gangrene, not specified as recurrent: Secondary | ICD-10-CM | POA: Diagnosis not present

## 2016-02-15 DIAGNOSIS — D176 Benign lipomatous neoplasm of spermatic cord: Secondary | ICD-10-CM | POA: Diagnosis not present

## 2016-02-15 DIAGNOSIS — Z833 Family history of diabetes mellitus: Secondary | ICD-10-CM | POA: Diagnosis not present

## 2016-02-15 DIAGNOSIS — K4020 Bilateral inguinal hernia, without obstruction or gangrene, not specified as recurrent: Secondary | ICD-10-CM | POA: Diagnosis not present

## 2016-02-15 DIAGNOSIS — Z8249 Family history of ischemic heart disease and other diseases of the circulatory system: Secondary | ICD-10-CM | POA: Diagnosis not present

## 2016-02-15 DIAGNOSIS — Z79899 Other long term (current) drug therapy: Secondary | ICD-10-CM | POA: Diagnosis not present

## 2016-02-15 DIAGNOSIS — Z803 Family history of malignant neoplasm of breast: Secondary | ICD-10-CM | POA: Diagnosis not present

## 2016-02-15 DIAGNOSIS — Z888 Allergy status to other drugs, medicaments and biological substances status: Secondary | ICD-10-CM | POA: Diagnosis not present

## 2016-02-15 DIAGNOSIS — I10 Essential (primary) hypertension: Secondary | ICD-10-CM | POA: Diagnosis not present

## 2016-02-15 DIAGNOSIS — Z800 Family history of malignant neoplasm of digestive organs: Secondary | ICD-10-CM | POA: Diagnosis not present

## 2016-02-15 DIAGNOSIS — K429 Umbilical hernia without obstruction or gangrene: Secondary | ICD-10-CM | POA: Diagnosis not present

## 2016-02-15 DIAGNOSIS — Z823 Family history of stroke: Secondary | ICD-10-CM | POA: Diagnosis not present

## 2016-04-02 DIAGNOSIS — M792 Neuralgia and neuritis, unspecified: Secondary | ICD-10-CM | POA: Diagnosis not present

## 2016-04-02 DIAGNOSIS — M4722 Other spondylosis with radiculopathy, cervical region: Secondary | ICD-10-CM | POA: Diagnosis not present

## 2016-04-02 DIAGNOSIS — S43431A Superior glenoid labrum lesion of right shoulder, initial encounter: Secondary | ICD-10-CM | POA: Diagnosis not present

## 2016-04-02 DIAGNOSIS — M25511 Pain in right shoulder: Secondary | ICD-10-CM | POA: Diagnosis not present

## 2016-05-30 ENCOUNTER — Ambulatory Visit (HOSPITAL_COMMUNITY)
Admission: RE | Admit: 2016-05-30 | Discharge: 2016-05-30 | Disposition: A | Discharge status: Home / Self Care | Payer: Medicare Other | Source: Ambulatory Visit | Attending: Registered Nurse | Admitting: Registered Nurse

## 2016-05-30 ENCOUNTER — Other Ambulatory Visit (HOSPITAL_COMMUNITY): Payer: Self-pay | Admitting: Registered Nurse

## 2016-05-30 DIAGNOSIS — R102 Pelvic and perineal pain: Secondary | ICD-10-CM | POA: Diagnosis not present

## 2016-05-30 DIAGNOSIS — N529 Male erectile dysfunction, unspecified: Secondary | ICD-10-CM | POA: Diagnosis present

## 2016-05-30 DIAGNOSIS — Z1389 Encounter for screening for other disorder: Secondary | ICD-10-CM | POA: Diagnosis not present

## 2016-05-30 DIAGNOSIS — M25559 Pain in unspecified hip: Secondary | ICD-10-CM

## 2016-05-30 DIAGNOSIS — I10 Essential (primary) hypertension: Secondary | ICD-10-CM | POA: Diagnosis not present

## 2016-06-11 DIAGNOSIS — K4020 Bilateral inguinal hernia, without obstruction or gangrene, not specified as recurrent: Secondary | ICD-10-CM | POA: Diagnosis not present

## 2016-06-11 DIAGNOSIS — K429 Umbilical hernia without obstruction or gangrene: Secondary | ICD-10-CM | POA: Diagnosis not present

## 2016-06-11 DIAGNOSIS — R2231 Localized swelling, mass and lump, right upper limb: Secondary | ICD-10-CM | POA: Diagnosis not present

## 2016-06-19 ENCOUNTER — Ambulatory Visit (INDEPENDENT_AMBULATORY_CARE_PROVIDER_SITE_OTHER): Payer: Medicare Other | Admitting: Orthopaedic Surgery

## 2016-06-19 ENCOUNTER — Encounter (INDEPENDENT_AMBULATORY_CARE_PROVIDER_SITE_OTHER): Payer: Self-pay | Admitting: Orthopaedic Surgery

## 2016-06-19 VITALS — BP 129/78 | HR 58 | Ht 71.0 in | Wt 194.0 lb

## 2016-06-19 DIAGNOSIS — M47812 Spondylosis without myelopathy or radiculopathy, cervical region: Secondary | ICD-10-CM | POA: Diagnosis not present

## 2016-06-19 DIAGNOSIS — M542 Cervicalgia: Secondary | ICD-10-CM

## 2016-06-19 DIAGNOSIS — M79601 Pain in right arm: Secondary | ICD-10-CM

## 2016-06-19 DIAGNOSIS — M7989 Other specified soft tissue disorders: Secondary | ICD-10-CM

## 2016-06-19 DIAGNOSIS — M799 Soft tissue disorder, unspecified: Secondary | ICD-10-CM

## 2016-06-22 DIAGNOSIS — M47812 Spondylosis without myelopathy or radiculopathy, cervical region: Secondary | ICD-10-CM | POA: Insufficient documentation

## 2016-06-22 DIAGNOSIS — M7989 Other specified soft tissue disorders: Secondary | ICD-10-CM | POA: Insufficient documentation

## 2016-06-22 NOTE — Progress Notes (Signed)
Office Visit Note   Patient: Stephen Kramer           Date of Birth: 1950/07/30           MRN: [id number] Visit Date: 06/19/2016              Requested by: Sharilyn Sites, [address], [place] [address] PCP: Sharilyn Sites, MD   Assessment & Plan: Visit Diagnoses:  1. Neck pain   2. Right arm pain   3. Soft tissue mass   4. Spondylosis without myelopathy or radiculopathy, cervical region     Plan: We discussed reviewed the plain radiographs. Will proceed with MRI scan of his subcutaneous soft tissue mass of his shoulder as well as cervical MRI scan. Office follow up after scan for review.  Follow-Up Instructions: Follow-up after MRIs.  Orders:  Orders Placed This Encounter  Procedures  . MR Cervical Spine w/o contrast  . MR HUMERUS RIGHT WO CONTRAST   No orders of the defined types were placed in this encounter.     Procedures: No procedures performed   Clinical Data: No additional findings.   Subjective: Chief Complaint  Patient presents with  . Neck - Pain  . Right Arm - Pain    HPI 66 year old male referred by Dr. Sharilyn Sites for evaluation of progressive neck pain with the pain radiates into the right arm and also an enlarging subcutaneous mass more than 15 years which is gradually become progressively painful. He states his numbness and tingling that radiates from his right hand down into the radial side of his hand. Pain when he turns his neck. He denies myelopathic symptoms. He denies fever chills. No other subcutaneous masses other than over his right posterior deltoid and upper triceps region. Patient's treat his neck symptoms with the intermittent use of a neck brace, anti-inflammatories and heat as well as ice. He had been placed on gabapentin by Dr. Jannifer Franklin last year.  Review of Systems 14 point review of system positive for a history of angina, prostatitis, rhinitis history of headaches. Negative for MI CVA and negative for malignancy.  Positive for hypertension. Otherwise negative as it pertains to his history of present illness   Objective: Vital Signs: BP 129/78   Pulse (!) 58   Ht 5\' 11"  (1.803 m)   Wt 194 lb (88 kg)   BMI 27.06 kg/m   Physical Exam  Constitutional: He is oriented to person, place, and time. He appears well-developed and well-nourished.  HENT:  Head: Normocephalic and atraumatic.  Eyes: EOM are normal. Pupils are equal, round, and reactive to light.  Neck: No tracheal deviation present. No thyromegaly present.  Cardiovascular: Normal rate.   Pulmonary/Chest: Effort normal. He has no wheezes.  Abdominal: Soft. Bowel sounds are normal.  Musculoskeletal:  Positive cervical compression test., Positive brachioplexus tenderness more on the right than left. Upper semi-reflexes are 2+. Slight improvement with distraction positive Spurling on the right negative on the left. Negative Lhermitte. Lower extremity reflexes are symmetrical no myelopathic changes normal gait. Subtendinous mass right posterior deltoid upper triceps region which is 15 x 8 cm and not attached to underlying muscle. His pain with Sherlynn Stalls is reaching. No periscapular atrophy . Serratus anterior function is intact. No scapular winging.  Neurological: He is alert and oriented to person, place, and time.  Skin: Skin is warm and dry. Capillary refill takes less than 2 seconds.  Psychiatric: He has a normal mood and affect. His behavior is normal.  Judgment and thought content normal.    Ortho Exam  Specialty Comments:  No specialty comments available.  Imaging: Outside x-rays demonstrate the mid cervical spondylosis with endplate spurring narrowing of the disc space uncovertebral changes.   PMFS History: Patient Active Problem List   Diagnosis Date Noted  . Soft tissue mass 06/22/2016  . Other headache syndrome 12/05/2015  . Pain in the chest   . Unstable angina ([place]) 02/11/2014  . Anemia 02/11/2014  . Chest pain 02/10/2014  .  RHINITIS 06/26/2009  . Osteoarthritis 06/26/2009  . NOCTURIA 06/26/2009  . PROSTATITIS, CHRONIC, HX OF 04/14/2007   Past Medical History:  Diagnosis Date  . Anemia   . Chest pain    a. 02/2014 MV: EF 67%, no ischemia/infarct.  . Chronic prostatitis   . Hypertension   . Nocturia     Family History  Problem Relation Age of Onset  . Colon cancer Brother   . Hypertension Mother   . Diabetes Mother   . Hypertension Father     Past Surgical History:  Procedure Laterality Date  . COLONOSCOPY    . HERNIA REPAIR     triple hernia surgery   Social History   Occupational History  . Not on file.   Social History Main Topics  . Smoking status: Never Smoker  . Smokeless tobacco: Never Used  . Alcohol use 2.4 oz/week    4 Cans of beer per week  . Drug use: No  . Sexual activity: Yes

## 2016-07-01 DIAGNOSIS — M4802 Spinal stenosis, cervical region: Secondary | ICD-10-CM | POA: Diagnosis not present

## 2016-07-01 DIAGNOSIS — D179 Benign lipomatous neoplasm, unspecified: Secondary | ICD-10-CM | POA: Diagnosis not present

## 2016-07-01 DIAGNOSIS — R2230 Localized swelling, mass and lump, unspecified upper limb: Secondary | ICD-10-CM | POA: Diagnosis not present

## 2016-07-01 DIAGNOSIS — M542 Cervicalgia: Secondary | ICD-10-CM | POA: Diagnosis not present

## 2016-07-01 DIAGNOSIS — M4302 Spondylolysis, cervical region: Secondary | ICD-10-CM | POA: Diagnosis not present

## 2016-07-01 DIAGNOSIS — M79601 Pain in right arm: Secondary | ICD-10-CM | POA: Diagnosis not present

## 2016-07-01 DIAGNOSIS — M9981 Other biomechanical lesions of cervical region: Secondary | ICD-10-CM | POA: Diagnosis not present

## 2016-07-03 ENCOUNTER — Ambulatory Visit (INDEPENDENT_AMBULATORY_CARE_PROVIDER_SITE_OTHER): Payer: Medicare Other | Admitting: Orthopaedic Surgery

## 2016-07-03 ENCOUNTER — Encounter (INDEPENDENT_AMBULATORY_CARE_PROVIDER_SITE_OTHER): Payer: Self-pay | Admitting: Orthopaedic Surgery

## 2016-07-03 VITALS — BP 158/88 | HR 64 | Ht 71.0 in | Wt 194.0 lb

## 2016-07-03 DIAGNOSIS — D1721 Benign lipomatous neoplasm of skin and subcutaneous tissue of right arm: Secondary | ICD-10-CM | POA: Diagnosis not present

## 2016-07-03 DIAGNOSIS — M47812 Spondylosis without myelopathy or radiculopathy, cervical region: Secondary | ICD-10-CM | POA: Diagnosis not present

## 2016-07-03 NOTE — Progress Notes (Signed)
Office Visit Note   Patient: Stephen Kramer           Date of Birth: Jun 24, 1950           MRN: [id number] Visit Date: 07/03/2016              Requested by: Sharilyn Sites, MD [address] 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 He does have some mild changes as C6-7 but would not require operative intervention for the mild changes at that level. We discussed risks of surgery including pseudoarthrosis, dysphonia, dysphasia, reoperation. Questions were elicited and answered he understands request to proceed. I reviewed the MRI scan gave him a copy of the report.  Follow-Up Instructions: No Follow-up on file.   Orders:  No orders of the defined types were placed in this encounter.  No orders of the defined types were placed in this encounter.     Procedures: No procedures performed   Clinical Data: No additional findings.   Subjective: Chief Complaint  Patient presents with  . Neck - Follow-up  . Right Shoulder - Follow-up    HPI 66 year old male referred by Dr. Sharilyn Sites for progressive neck pain arm pain on the right radiating in his shoulders for the last 5 years. Progressive enlarging subcutaneous lipoma from last 15 years. Patient has numbness and tingling that radiates down the radial side of his right hand. Pain with turning his neck. Failed anti-inflammatories conservative treatment, use of a neck brace, prednisone pack, Neurontin and physical therapy. He is use ice and heat without relief. MRI scan is been obtained of both the shoulder to evaluate the very large lipoma that's 11 x 7 x 4 cm. Cervical MRI shows spondylosis with stenosis at C4-5 C5-6 with flattening of the cord.  Review of Systems 14 point review of systems  positive for history of angina, negative for MRI. Negative CVA negative for malignancy. Positive for hypertension. Enlarging lipoma right posterior upper arm and shoulder and chronic neck pain with spondylosis. Discharged 2016 for chest pain suspected GI origin cardiac workup and enzymes and EKG negative.   Objective: Vital Signs: BP (!) 158/88   Pulse 64   Ht 5\' 11"  (1.803 m)   Wt 194 lb (88 kg)   BMI 27.06 kg/m   Physical Exam  Constitutional: He is oriented to person, place, and time. He appears well-developed and well-nourished.  HENT:  Head: Normocephalic and atraumatic.  Eyes: EOM are normal. Pupils are equal, round, and reactive to light.  Neck: No tracheal deviation present. No thyromegaly present.  Cardiovascular: Normal rate.   Pulmonary/Chest: Effort normal. He has no wheezes.  Abdominal: Soft. Bowel sounds are normal.  Musculoskeletal:  Large lipoma greater than 10 cm posterior shoulder adjacent and overlying the posterior deltoid and portion of the triceps. It is not attached to the muscle by exam. Negative impingement of the shoulder. Decreased sensation radial side of his hand C6 distribution with severe brachial plexus tenderness on the right positive Spurling on the right. Brachial plexus tenderness on the left negative Spurling on the left negative were made. Upper extremity reflexes are 2+ and symmetrical no lower extremity clonus no hyperreflexia lower extremities. Good biceps triceps wrist flexion-extension strength.  Neurological: He is alert and oriented to person, place, and time.  Skin:  Skin is warm and dry. Capillary refill takes less than 2 seconds.  Psychiatric: He has a normal mood and affect. His behavior is normal. Judgment and thought content normal.    Ortho Exam no periscapular atrophy no scapular winging negative shoulder shrug. No tenderness supraspinatus fossa with palpation. Normal internal/external rotation strength of the shoulder.  Specialty  Comments:  No specialty comments available.  Imaging: MRI right shoulder shows [phone] cm ill-defined subcutaneous lipoma with no worrisome features. It does not appear to be encapsulated. No involvement of the underlying musculature.  MRI cervical spine shows mid cervical spondylosis with stenosis with most severe changes at C4-5 with chronic broad-based disc bulge flattening of the ventral thecal sac and flattening of the cord without cord signal changes. Moderate central stenosis and moderate foraminal stenosis on the right. Severe foraminal stenosis on the left. At C5-6 there is posterior disc bulging with cord flattening without cord signal changes. Bilateral moderate foraminal stenosis worse on the left. C6-7 shows disc bulge with mild narrowing no cord flattening and mild foraminal narrowing. Read by Dr. Jeannine Boga on 07/01/2016   PMFS History: Patient Active Problem List   Diagnosis Date Noted  . Lipoma of right upper extremity 07/03/2016  . Soft tissue mass 06/22/2016  . Spondylosis without myelopathy or radiculopathy, cervical region 06/22/2016  . Other headache syndrome 12/05/2015  . Pain in the chest   . Unstable angina ([place]) 02/11/2014  . Anemia 02/11/2014  . Chest pain 02/10/2014  . RHINITIS 06/26/2009  . Osteoarthritis 06/26/2009  . NOCTURIA 06/26/2009  . PROSTATITIS, CHRONIC, HX OF 04/14/2007   Past Medical History:  Diagnosis Date  . Anemia   . Chest pain    a. 02/2014 MV: EF 67%, no ischemia/infarct.  . Chronic prostatitis   . Hypertension   . Nocturia     Family History  Problem Relation Age of Onset  . Colon cancer Brother   . Hypertension Mother   . Diabetes Mother   . Hypertension Father     Past Surgical History:  Procedure Laterality Date  . COLONOSCOPY    . HERNIA REPAIR     triple hernia surgery   Social History   Occupational History  . Not on file.   Social History Main Topics  . Smoking status: Never Smoker  . Smokeless  tobacco: Never Used  . Alcohol use 2.4 oz/week    4 Cans of beer per week  . Drug use: No  . Sexual activity: Yes

## 2016-07-23 DIAGNOSIS — K4020 Bilateral inguinal hernia, without obstruction or gangrene, not specified as recurrent: Secondary | ICD-10-CM | POA: Diagnosis not present

## 2016-07-23 DIAGNOSIS — R2231 Localized swelling, mass and lump, right upper limb: Secondary | ICD-10-CM | POA: Diagnosis not present

## 2016-07-23 DIAGNOSIS — K429 Umbilical hernia without obstruction or gangrene: Secondary | ICD-10-CM | POA: Diagnosis not present

## 2016-08-05 ENCOUNTER — Telehealth: Payer: Self-pay

## 2016-08-05 NOTE — Telephone Encounter (Signed)
Gastroenterology Pre-Procedure Review  Request Date: Requesting Physician:   LAST TCS WAS 09/2010 HAD DIMINUTIVE POLYP BY [facility]  PATIENT REVIEW QUESTIONS: The patient responded to the following health history questions as indicated:    1. Diabetes Melitis: NO 2. Joint replacements in the past 12 months: NO 3. Major health problems in the past 3 months: NO 4. Has an artificial valve or MVP: NO 5. Has a defibrillator: NO 6. Has been advised in past to take antibiotics in advance of a procedure like teeth cleaning: NO 7. Family history of colon cancer: YES,BROTHER PASSED AWAY 8. Alcohol Use: NO 9. History of sleep apnea: NO 10. History of coronary artery or other vascular stents placed within the last 12 months: NO 11. History of any prior anesthesia complications: NO    MEDICATIONS & ALLERGIES:    Patient reports the following regarding taking any blood thinners:   Plavix? NO Aspirin? NO Coumadin? NO Brilinta? NO Xarelto? NO Eliquis? NO Pradaxa? NO Savaysa? NO Effient? NO  Patient confirms/reports the following medications:  Current Outpatient Prescriptions  Medication Sig Dispense Refill  . amLODipine (NORVASC) 10 MG tablet Take 10 mg by mouth daily.  3  . Multiple Vitamins-Minerals (CENTRUM SILVER PO) Take by mouth.    . celecoxib (CELEBREX) 100 MG capsule Take 100 mg by mouth daily as needed.    . cetirizine (ZYRTEC) 10 MG tablet Take 1 tablet (10 mg total) by mouth daily. (Patient not taking: Reported on 06/19/2016) 30 tablet 11  . Coenzyme Q10 (COQ-10 PO) Take by mouth.    . gabapentin (NEURONTIN) 100 MG capsule One capsule in the morning and 2 in the evening (Patient not taking: Reported on 06/19/2016) 90 capsule 2  . ipratropium (ATROVENT) 0.03 % nasal spray Place 2 sprays into the nose 2 (two) times daily. (Patient not taking: Reported on 06/19/2016) 30 mL 1   No current facility-administered medications for this visit.     Patient confirms/reports the following  allergies:  Allergies  Allergen Reactions  . Feldene [Piroxicam] Other (See Comments)    Diarrhea    No orders of the defined types were placed in this encounter.   AUTHORIZATION INFORMATION Primary Insurance: MEDICARE,  ID #: [id number],  Group #:  Pre-Cert / Auth required:  Pre-Cert / Auth #:    SCHEDULE INFORMATION: Procedure has been scheduled as follows:  Date: , Time:   Location:   This Gastroenterology Pre-Precedure Review Form is being routed to the following provider(s): Barney Drain, MD

## 2016-08-07 ENCOUNTER — Telehealth: Payer: Self-pay

## 2016-08-07 NOTE — Telephone Encounter (Signed)
Forwarding to Ginger who triaged.  

## 2016-08-07 NOTE — Telephone Encounter (Signed)
Pt was returning a call from GF. He said his colonoscopy was 9/17. [phone]

## 2016-08-07 NOTE — Telephone Encounter (Signed)
LMOM to call back

## 2016-08-07 NOTE — Telephone Encounter (Signed)
Polyp was benign in 2012. However, he has a family history of colon cancer. Proceed.

## 2016-08-08 ENCOUNTER — Other Ambulatory Visit: Payer: Self-pay

## 2016-08-08 DIAGNOSIS — Z800 Family history of malignant neoplasm of digestive organs: Secondary | ICD-10-CM

## 2016-08-08 MED ORDER — PEG 3350-KCL-NA BICARB-NACL 420 G PO SOLR: 4000.0000 mL | ORAL | 0 refills | Status: DC

## 2016-08-08 NOTE — Telephone Encounter (Signed)
Orders entered. Rx for prep sent to pharmacy. PA info for TCS submitted via [facility] website. No PA needed, Decision ID# [id number].

## 2016-08-08 NOTE — Telephone Encounter (Signed)
Pt is set up for TCS on 09/22/16 @ 1:45 pm. He is aware and instructions are in the mail

## 2016-08-08 NOTE — Telephone Encounter (Signed)
See other phone note

## 2016-09-22 ENCOUNTER — Encounter (HOSPITAL_COMMUNITY): Payer: Self-pay

## 2016-09-22 ENCOUNTER — Ambulatory Visit (HOSPITAL_COMMUNITY)
Admission: RE | Admit: 2016-09-22 | Discharge: 2016-09-22 | Disposition: A | Discharge status: Home / Self Care | Payer: Medicare Other | Source: Ambulatory Visit | Attending: Gastroenterology | Admitting: Gastroenterology

## 2016-09-22 ENCOUNTER — Encounter (HOSPITAL_COMMUNITY)
Admission: RE | Disposition: A | Discharge status: Home / Self Care | Payer: Self-pay | Source: Ambulatory Visit | Attending: Gastroenterology

## 2016-09-22 DIAGNOSIS — I10 Essential (primary) hypertension: Secondary | ICD-10-CM | POA: Diagnosis not present

## 2016-09-22 DIAGNOSIS — K5730 Diverticulosis of large intestine without perforation or abscess without bleeding: Secondary | ICD-10-CM | POA: Diagnosis not present

## 2016-09-22 DIAGNOSIS — Z79899 Other long term (current) drug therapy: Secondary | ICD-10-CM | POA: Insufficient documentation

## 2016-09-22 DIAGNOSIS — K648 Other hemorrhoids: Secondary | ICD-10-CM | POA: Insufficient documentation

## 2016-09-22 DIAGNOSIS — Z800 Family history of malignant neoplasm of digestive organs: Secondary | ICD-10-CM | POA: Diagnosis not present

## 2016-09-22 DIAGNOSIS — K644 Residual hemorrhoidal skin tags: Secondary | ICD-10-CM | POA: Insufficient documentation

## 2016-09-22 DIAGNOSIS — Z1211 Encounter for screening for malignant neoplasm of colon: Secondary | ICD-10-CM | POA: Diagnosis not present

## 2016-09-22 DIAGNOSIS — D649 Anemia, unspecified: Secondary | ICD-10-CM | POA: Insufficient documentation

## 2016-09-22 HISTORY — PX: COLONOSCOPY: SHX5424

## 2016-09-22 SURGERY — COLONOSCOPY
Anesthesia: Moderate Sedation

## 2016-09-22 MED ORDER — MEPERIDINE HCL 100 MG/ML IJ SOLN
INTRAMUSCULAR | Status: AC
  Filled 2016-09-22: qty 2

## 2016-09-22 MED ORDER — SODIUM CHLORIDE 0.9 % IV SOLN
INTRAVENOUS | Status: DC
  Administered 2016-09-22: 13:00:00 via INTRAVENOUS

## 2016-09-22 MED ORDER — MIDAZOLAM HCL 5 MG/5ML IJ SOLN
INTRAMUSCULAR | Status: DC | PRN
  Administered 2016-09-22: 1 mg via INTRAVENOUS
  Administered 2016-09-22 (×2): 2 mg via INTRAVENOUS

## 2016-09-22 MED ORDER — ONDANSETRON HCL 4 MG/2ML IJ SOLN
INTRAMUSCULAR | Status: AC
  Filled 2016-09-22: qty 2

## 2016-09-22 MED ORDER — ONDANSETRON HCL 4 MG/2ML IJ SOLN
INTRAMUSCULAR | Status: DC | PRN
  Administered 2016-09-22: 4 mg via INTRAVENOUS

## 2016-09-22 MED ORDER — MIDAZOLAM HCL 5 MG/5ML IJ SOLN
INTRAMUSCULAR | Status: AC
  Filled 2016-09-22: qty 10

## 2016-09-22 MED ORDER — STERILE WATER FOR IRRIGATION IR SOLN
Status: DC | PRN
  Administered 2016-09-22: 2.5 mL

## 2016-09-22 MED ORDER — MEPERIDINE HCL 100 MG/ML IJ SOLN
INTRAMUSCULAR | Status: DC | PRN
  Administered 2016-09-22 (×2): 50 mg via INTRAVENOUS

## 2016-09-22 NOTE — H&P (Signed)
  Primary Care Physician:  Sharilyn Sites, MD Primary Gastroenterologist:  Dr. Oneida Alar  Pre-Procedure History & Physical: HPI:  Stephen Kramer is a 66 y.o. male here for COLON CANCER SCREENING-BRO COLON CA AGE < 70.  Past Medical History:  Diagnosis Date  . Anemia   . Chest pain    a. 02/2014 MV: EF 67%, no ischemia/infarct.  . Chronic prostatitis   . Hypertension   . Nocturia     Past Surgical History:  Procedure Laterality Date  . COLONOSCOPY    . HERNIA REPAIR     triple hernia surgery    Prior to Admission medications   Medication Sig Start Date End Date Taking? Authorizing Provider  amLODipine (NORVASC) 10 MG tablet Take 10 mg by mouth daily. 11/14/15  Yes [provider]  Coenzyme Q10 (COQ-10 PO) Take by mouth.   Yes [provider]  Glucos-Chond-Hyal Ac-Ca Fructo (MOVE FREE JOINT HEALTH ADVANCE) TABS Take 1 tablet by mouth daily.   Yes [provider]  Multiple Vitamins-Minerals (CENTRUM SILVER PO) Take 1 tablet by mouth daily.    Yes [provider]  polyethylene glycol-electrolytes (TRILYTE) 420 g solution Take 4,000 mLs by mouth as directed. 08/08/16  Yes Danie Binder, MD    Allergies as of 08/08/2016 - Review Complete 08/05/2016  Allergen Reaction Noted  . Feldene [piroxicam] Other (See Comments) 02/10/2014    Family History  Problem Relation Age of Onset  . Colon cancer Brother   . Hypertension Mother   . Diabetes Mother   . Hypertension Father     Social History   Social History  . Marital status: Married    Spouse name: N/A  . Number of children: N/A  . Years of education: N/A   Occupational History  . Not on file.   Social History Main Topics  . Smoking status: Never Smoker  . Smokeless tobacco: Never Used  . Alcohol use 2.4 oz/week    4 Cans of beer per week  . Drug use: No  . Sexual activity: Yes   Other Topics Concern  . Not on file   Social History Narrative  . No narrative on file    Review  of Systems: See HPI, otherwise negative ROS   Physical Exam: BP (!) 113/99   Pulse 63   Temp 98.4 F (36.9 C) (Oral)   Resp 16   Ht 5\' 11"  (1.803 m)   Wt 194 lb (88 kg)   SpO2 99%   BMI 27.06 kg/m  General:   Alert,  pleasant and cooperative in NAD Head:  Normocephalic and atraumatic. Neck:  Supple; Lungs:  Clear throughout to auscultation.    Heart:  Regular rate and rhythm. Abdomen:  Soft, nontender and nondistended. Normal bowel sounds, without guarding, and without rebound.   Neurologic:  Alert and  oriented x4;  grossly normal neurologically.  Impression/Plan:    SCREENING: FAMILY Hx COLON CA-BROTHER HAD COLON CA AGE < 60.  PLAN: 1. TCS TODAY. DISCUSSED PROCEDURE, BENEFITS, & RISKS: < 1% chance of medication reaction, bleeding, perforation, or rupture of spleen/liver.

## 2016-09-22 NOTE — Op Note (Signed)
[facility] Patient Name: Stephen Kramer Procedure Date: 09/22/2016 12:57 PM MRN: [id number] Date of Birth: 1950-08-06 Attending MD: Barney Drain MD, MD CSN: [id number] Age: 66 Admit Type: Outpatient Procedure:                Colonoscopy, SCREENING Indications:              Family history of colon cancer in a first-degree                            relative Providers:                Barney Drain MD, MD, Gwynneth Albright RN, RN,                            Rosina Lowenstein, RN Referring MD:             Halford Chessman MD, MD Medicines:                Ondansetron 4 mg IV, Meperidine 100 mg IV,                            Midazolam 5 mg IV Complications:            No immediate complications. Estimated Blood Loss:     Estimated blood loss: none. Procedure:                Pre-Anesthesia Assessment:                           - Prior to the procedure, a History and Physical                            was performed, and patient medications and                            allergies were reviewed. The patient's tolerance of                            previous anesthesia was also reviewed. The risks                            and benefits of the procedure and the sedation                            options and risks were discussed with the patient.                            All questions were answered, and informed consent                            was obtained. Prior Anticoagulants: The patient has                            taken no previous anticoagulant or antiplatelet  agents. ASA Grade Assessment: I - A normal, healthy                            patient. After reviewing the risks and benefits,                            the patient was deemed in satisfactory condition to                            undergo the procedure. After obtaining informed                            consent, the colonoscope was passed under direct                            vision.  Throughout the procedure, the patient's                            blood pressure, pulse, and oxygen saturations were                            monitored continuously. The EC-[id number] ([id number])                            scope was introduced through the anus and advanced                            to the the cecum, identified by appendiceal orifice                            and ileocecal valve. The colonoscopy was                            technically difficult and complex due to restricted                            mobility of the colon. Successful completion of the                            procedure was aided by increasing the dose of                            sedation medication, straightening and shortening                            the scope to obtain bowel loop reduction and                            COLOWRAP. The patient tolerated the procedure                            fairly well. The quality of the bowel preparation  was excellent. The ileocecal valve, appendiceal                            orifice, and rectum were photographed. Scope In: 1:39:26 PM Scope Out: 1:52:42 PM Scope Withdrawal Time: 0 hours 8 minutes 52 seconds  Total Procedure Duration: 0 hours 13 minutes 16 seconds  Findings:      The recto-sigmoid colon and sigmoid colon were grossly tortuous.      Multiple small and large-mouthed diverticula were found in the       recto-sigmoid colon, sigmoid colon and descending colon.      External and internal hemorrhoids were found during retroflexion. The       hemorrhoids were small. Impression:               - Tortuous RECTOSIGMOID colon.                           - SEVERE Diverticulosis in the recto-sigmoid colon,                            in the sigmoid colon and in the descending colon.                           - External and internal hemorrhoids. Moderate Sedation:      Moderate (conscious) sedation was administered by the  endoscopy nurse       and supervised by the endoscopist. The following parameters were       monitored: oxygen saturation, heart rate, blood pressure, and response       to care. Total physician intraservice time was 23 minutes. Recommendation:           - Repeat colonoscopy in 5 years for surveillance.                           - High fiber diet.                           - Continue present medications.                           - Patient has a contact number available for                            emergencies. The signs and symptoms of potential                            delayed complications were discussed with the                            patient. Return to normal activities tomorrow.                            Written discharge instructions were provided to the                            patient. Procedure Code(s):        --- Professional ---  [phone], Colonoscopy, flexible; diagnostic, including                            collection of specimen(s) by brushing or washing,                            when performed (separate procedure)                           99152, Moderate sedation services provided by the                            same physician or other qualified health care                            professional performing the diagnostic or                            therapeutic service that the sedation supports,                            requiring the presence of an independent trained                            observer to assist in the monitoring of the                            patient's level of consciousness and physiological                            status; initial 15 minutes of intraservice time,                            patient age 59 years or older                           [phone], Moderate sedation services; each additional                            15 minutes intraservice time Diagnosis Code(s):        --- Professional ---                            K64.8, Other hemorrhoids                           Z80.0, Family history of malignant neoplasm of                            digestive organs                           K57.30, Diverticulosis of large intestine without                            perforation or abscess  without bleeding                           Q43.8, Other specified congenital malformations of                            intestine CPT copyright 2016 [employer]. All rights reserved. The codes documented in this report are preliminary and upon coder review may  be revised to meet current compliance requirements. Barney Drain, MD Barney Drain MD, MD 09/22/2016 2:07:11 PM This report has been signed electronically. Number of Addenda: 0

## 2016-09-22 NOTE — Discharge Instructions (Signed)
YOU DID NOT HAVE ANY POLYPS.  You have small internal and external hemorrhoids and diverticulosis in your left colon.   DRINK WATER TO KEEP YOUR URINE LIGHT YELLOW.  FOLLOW A HIGH FIBER DIET. AVOID ITEMS THAT CAUSE BLOATING & GAS. See info below.  Next colonoscopy in  5 years. Colonoscopy Care After Read the instructions outlined below and refer to this sheet in the next week. These discharge instructions provide you with general information on caring for yourself after you leave the hospital. While your treatment has been planned according to the most current medical practices available, unavoidable complications occasionally occur. If you have any problems or questions after discharge, call DR. Steadman Prosperi, [phone].  ACTIVITY  You may resume your regular activity, but move at a slower pace for the next 24 hours.   Take frequent rest periods for the next 24 hours.   Walking will help get rid of the air and reduce the bloated feeling in your belly (abdomen).   No driving for 24 hours (because of the medicine (anesthesia) used during the test).   You may shower.   Do not sign any important legal documents or operate any machinery for 24 hours (because of the anesthesia used during the test).    NUTRITION  Drink plenty of fluids.   You may resume your normal diet as instructed by your doctor.   Begin with a light meal and progress to your normal diet. Heavy or fried foods are harder to digest and may make you feel sick to your stomach (nauseated).   Avoid alcoholic beverages for 24 hours or as instructed.    MEDICATIONS  You may resume your normal medications.   WHAT YOU CAN EXPECT TODAY  Some feelings of bloating in the abdomen.   Passage of more gas than usual.   Spotting of blood in your stool or on the toilet paper  .  IF YOU HAD POLYPS REMOVED DURING THE COLONOSCOPY:  Eat a soft diet IF YOU HAVE NAUSEA, BLOATING, ABDOMINAL PAIN, OR VOMITING.    FINDING OUT  THE RESULTS OF YOUR TEST Not all test results are available during your visit. DR. Oneida Alar WILL CALL YOU WITHIN 7 DAYS OF YOUR PROCEDUE WITH YOUR RESULTS. Do not assume everything is normal if you have not heard from DR. Darriana Deboy IN ONE WEEK, CALL HER OFFICE AT [phone].  SEEK IMMEDIATE MEDICAL ATTENTION AND CALL THE OFFICE: [phone] IF:  You have more than a spotting of blood in your stool.   Your belly is swollen (abdominal distention).   You are nauseated or vomiting.   You have a temperature over 101F.   You have abdominal pain or discomfort that is severe or gets worse throughout the day.  High-Fiber Diet A high-fiber diet changes your normal diet to include more whole grains, legumes, fruits, and vegetables. Changes in the diet involve replacing refined carbohydrates with unrefined foods. The calorie level of the diet is essentially unchanged. The Dietary Reference Intake (recommended amount) for adult males is 38 grams per day. For adult females, it is 25 grams per day. Pregnant and lactating women should consume 28 grams of fiber per day. Fiber is the intact part of a plant that is not broken down during digestion. Functional fiber is fiber that has been isolated from the plant to provide a beneficial effect in the body. PURPOSE  Increase stool bulk.   Ease and regulate bowel movements.   Lower cholesterol.  INDICATIONS THAT YOU NEED MORE FIBER  Constipation and hemorrhoids.   Uncomplicated diverticulosis (intestine condition) and irritable bowel syndrome.   Weight management.   As a protective measure against hardening of the arteries (atherosclerosis), diabetes, and cancer.   GUIDELINES FOR INCREASING FIBER IN THE DIET  Start adding fiber to the diet slowly. A gradual increase of about 5 more grams (2 slices of whole-wheat bread, 2 servings of most fruits or vegetables, or 1 bowl of high-fiber cereal) per day is best. Too rapid an increase in fiber may result in  constipation, flatulence, and bloating.   Drink enough water and fluids to keep your urine clear or pale yellow. Water, juice, or caffeine-free drinks are recommended. Not drinking enough fluid may cause constipation.   Eat a variety of high-fiber foods rather than one type of fiber.   Try to increase your intake of fiber through using high-fiber foods rather than fiber pills or supplements that contain small amounts of fiber.   The goal is to change the types of food eaten. Do not supplement your present diet with high-fiber foods, but replace foods in your present diet.   INCLUDE A VARIETY OF FIBER SOURCES  Replace refined and processed grains with whole grains, canned fruits with fresh fruits, and incorporate other fiber sources. White rice, white breads, and most bakery goods contain little or no fiber.   Brown whole-grain rice, buckwheat oats, and many fruits and vegetables are all good sources of fiber. These include: broccoli, Brussels sprouts, cabbage, cauliflower, beets, sweet potatoes, white potatoes (skin on), carrots, tomatoes, eggplant, squash, berries, fresh fruits, and dried fruits.   Cereals appear to be the richest source of fiber. Cereal fiber is found in whole grains and bran. Bran is the fiber-rich outer coat of cereal grain, which is largely removed in refining. In whole-grain cereals, the bran remains. In breakfast cereals, the largest amount of fiber is found in those with "bran" in their names. The fiber content is sometimes indicated on the label.   You may need to include additional fruits and vegetables each day.   In baking, for 1 cup white flour, you may use the following substitutions:   1 cup whole-wheat flour minus 2 tablespoons.   1/2 cup white flour plus 1/2 cup whole-wheat flour.   Diverticulosis Diverticulosis is a common condition that develops when small pouches (diverticula) form in the wall of the colon. The risk of diverticulosis increases with age.  It happens more often in people who eat a low-fiber diet. Most individuals with diverticulosis have no symptoms. Those individuals with symptoms usually experience belly (abdominal) pain, constipation, or loose stools (diarrhea).  HOME CARE INSTRUCTIONS  Increase the amount of fiber in your diet as directed by your caregiver or dietician. This may reduce symptoms of diverticulosis.   Drink at least 6 to 8 glasses of water each day to prevent constipation.   Try not to strain when you have a bowel movement.   Avoiding nuts and seeds to prevent complications is NOT NECESSARY.      FOODS HAVING HIGH FIBER CONTENT INCLUDE:  Fruits. Apple, peach, pear, tangerine, raisins, prunes.   Vegetables. Brussels sprouts, asparagus, broccoli, cabbage, carrot, cauliflower, romaine lettuce, spinach, summer squash, tomato, winter squash, zucchini.   Starchy Vegetables. Baked beans, kidney beans, lima beans, split peas, lentils, potatoes (with skin).   Grains. Whole wheat bread, brown rice, bran flake cereal, plain oatmeal, white rice, shredded wheat, bran muffins.    SEEK IMMEDIATE MEDICAL CARE IF:  You develop increasing pain or  severe bloating.   You have an oral temperature above 101F.   You develop vomiting or bowel movements that are bloody or black.   Hemorrhoids Hemorrhoids are dilated (enlarged) veins around the rectum. Sometimes clots will form in the veins. This makes them swollen and painful. These are called thrombosed hemorrhoids. Causes of hemorrhoids include:  Constipation.   Straining to have a bowel movement.   HEAVY LIFTING  HOME CARE INSTRUCTIONS  Eat a well balanced diet and drink 6 to 8 glasses of water every day to avoid constipation. You may also use a bulk laxative.   Avoid straining to have bowel movements.   Keep anal area dry and clean.   Do not use a donut shaped pillow or sit on the toilet for long periods. This increases blood pooling and pain.   Move  your bowels when your body has the urge; this will require less straining and will decrease pain and pressure.

## 2016-09-24 ENCOUNTER — Encounter (HOSPITAL_COMMUNITY): Payer: Self-pay | Admitting: Gastroenterology

## 2016-11-18 NOTE — Pre-Procedure Instructions (Signed)
Stephen Kramer  11/18/2016      [id number] - [place], [place] [address] [place] [address] Phone: [phone] Fax: [phone]    Your procedure is scheduled on  Friday November 16.  Report to [facility] Admitting at 5:30 A.M.  Call this number if you have problems the morning of surgery:  [phone]   Remember:  Do not eat food or drink liquids after midnight.  Take these medicines the morning of surgery with A SIP OF WATER: '  Amlodipine (norvasc) Cetirizine (zyrtec)  7 days prior to surgery STOP taking any Aspirin (unless otherwise instructed by your surgeon), Aleve, Naproxen, Ibuprofen, Motrin, Advil, Goody's, BC's, all herbal medications, fish oil, and all vitamins    Do not wear jewelry, make-up or nail polish.  Do not wear lotions, powders, or perfumes, or deoderant.  Do not shave 48 hours prior to surgery.  Men may shave face and neck.  Do not bring valuables to the hospital.  [facility] is not responsible for any belongings or valuables.  Contacts, dentures or bridgework may not be worn into surgery.  Leave your suitcase in the car.  After surgery it may be brought to your room.  For patients admitted to the hospital, discharge time will be determined by your treatment team.  Patients discharged the day of surgery will not be allowed to drive home.   Special instructions:    [facility]- Preparing For Surgery  Before surgery, you can play an important role. Because skin is not sterile, your skin needs to be as free of germs as possible. You can reduce the number of germs on your skin by washing with CHG (chlorahexidine gluconate) Soap before surgery.  CHG is an antiseptic cleaner which kills germs and bonds with the skin to continue killing germs even after washing.  Please do not use if you have an allergy to CHG or antibacterial soaps. If your skin becomes reddened/irritated stop using  the CHG.  Do not shave (including legs and underarms) for at least 48 hours prior to first CHG shower. It is OK to shave your face.  Please follow these instructions carefully.   1. Shower the NIGHT BEFORE SURGERY and the MORNING OF SURGERY with CHG.   2. If you chose to wash your hair, wash your hair first as usual with your normal shampoo.  3. After you shampoo, rinse your hair and body thoroughly to remove the shampoo.  4. Use CHG as you would any other liquid soap. You can apply CHG directly to the skin and wash gently with a scrungie or a clean washcloth.   5. Apply the CHG Soap to your body ONLY FROM THE NECK DOWN.  Do not use on open wounds or open sores. Avoid contact with your eyes, ears, mouth and genitals (private parts). Wash Face and genitals (private parts)  with your normal soap.  6. Wash thoroughly, paying special attention to the area where your surgery will be performed.  7. Thoroughly rinse your body with warm water from the neck down.  8. DO NOT shower/wash with your normal soap after using and rinsing off the CHG Soap.  9. Pat yourself dry with a CLEAN TOWEL.  10. Wear CLEAN PAJAMAS to bed the night before surgery, wear comfortable clothes the morning of surgery  11. Place CLEAN SHEETS on your bed the night of your first shower and DO NOT SLEEP WITH  PETS.    Day of Surgery: Do not apply any deodorants/lotions. Please wear clean clothes to the hospital/surgery center.      Please read over the following fact sheets that you were given. Coughing and Deep Breathing, MRSA Information and Surgical Site Infection Prevention

## 2016-11-19 ENCOUNTER — Encounter (HOSPITAL_COMMUNITY): Payer: Self-pay

## 2016-11-19 ENCOUNTER — Encounter (HOSPITAL_COMMUNITY)
Admission: RE | Admit: 2016-11-19 | Discharge: 2016-11-19 | Disposition: A | Discharge status: Home / Self Care | Payer: Medicare Other | Source: Ambulatory Visit | Attending: Orthopaedic Surgery | Admitting: Orthopaedic Surgery

## 2016-11-19 ENCOUNTER — Other Ambulatory Visit: Payer: Self-pay

## 2016-11-19 DIAGNOSIS — Z800 Family history of malignant neoplasm of digestive organs: Secondary | ICD-10-CM | POA: Diagnosis not present

## 2016-11-19 DIAGNOSIS — Z888 Allergy status to other drugs, medicaments and biological substances status: Secondary | ICD-10-CM | POA: Diagnosis not present

## 2016-11-19 DIAGNOSIS — Z79899 Other long term (current) drug therapy: Secondary | ICD-10-CM | POA: Diagnosis not present

## 2016-11-19 DIAGNOSIS — Z862 Personal history of diseases of the blood and blood-forming organs and certain disorders involving the immune mechanism: Secondary | ICD-10-CM | POA: Diagnosis not present

## 2016-11-19 DIAGNOSIS — Z833 Family history of diabetes mellitus: Secondary | ICD-10-CM | POA: Diagnosis not present

## 2016-11-19 DIAGNOSIS — Z01810 Encounter for preprocedural cardiovascular examination: Secondary | ICD-10-CM | POA: Diagnosis not present

## 2016-11-19 DIAGNOSIS — M47812 Spondylosis without myelopathy or radiculopathy, cervical region: Secondary | ICD-10-CM | POA: Diagnosis not present

## 2016-11-19 DIAGNOSIS — N411 Chronic prostatitis: Secondary | ICD-10-CM | POA: Diagnosis not present

## 2016-11-19 DIAGNOSIS — M4802 Spinal stenosis, cervical region: Secondary | ICD-10-CM | POA: Diagnosis not present

## 2016-11-19 DIAGNOSIS — Z01812 Encounter for preprocedural laboratory examination: Secondary | ICD-10-CM | POA: Diagnosis not present

## 2016-11-19 DIAGNOSIS — Z8249 Family history of ischemic heart disease and other diseases of the circulatory system: Secondary | ICD-10-CM | POA: Diagnosis not present

## 2016-11-19 DIAGNOSIS — K219 Gastro-esophageal reflux disease without esophagitis: Secondary | ICD-10-CM | POA: Diagnosis not present

## 2016-11-19 DIAGNOSIS — I10 Essential (primary) hypertension: Secondary | ICD-10-CM | POA: Diagnosis not present

## 2016-11-19 HISTORY — DX: Gastro-esophageal reflux disease without esophagitis: K21.9

## 2016-11-19 LAB — CBC
HCT: 43.9 % (ref 39.0–52.0)
Hemoglobin: 14.5 g/dL (ref 13.0–17.0)
MCH: 29.1 pg (ref 26.0–34.0)
MCHC: 33 g/dL (ref 30.0–36.0)
MCV: 88.2 fL (ref 78.0–100.0)
Platelets: 186 10*3/uL (ref 150–400)
RBC: 4.98 MIL/uL (ref 4.22–5.81)
RDW: 14.5 % (ref 11.5–15.5)
WBC: 5.1 10*3/uL (ref 4.0–10.5)

## 2016-11-19 LAB — COMPREHENSIVE METABOLIC PANEL
ALBUMIN: 3.9 g/dL (ref 3.5–5.0)
ALK PHOS: 71 U/L (ref 38–126)
ALT: 25 U/L (ref 17–63)
AST: 27 U/L (ref 15–41)
Anion gap: 10 (ref 5–15)
BILIRUBIN TOTAL: 2 mg/dL — AB (ref 0.3–1.2)
BUN: 9 mg/dL (ref 6–20)
CALCIUM: 9.1 mg/dL (ref 8.9–10.3)
CO2: 20 mmol/L — ABNORMAL LOW (ref 22–32)
CREATININE: 0.91 mg/dL (ref 0.61–1.24)
Chloride: 106 mmol/L (ref 101–111)
GFR calc Af Amer: >60 mL/min (ref >60)
GFR calc non Af Amer: >60 mL/min (ref >60)
GLUCOSE: 96 mg/dL (ref 65–99)
Potassium: 3.6 mmol/L (ref 3.5–5.1)
Sodium: 136 mmol/L (ref 135–145)
TOTAL PROTEIN: 7.1 g/dL (ref 6.5–8.1)

## 2016-11-19 LAB — SURGICAL PCR SCREEN
MRSA, PCR: NEGATIVE
Staphylococcus aureus: NEGATIVE

## 2016-11-19 LAB — URINALYSIS, ROUTINE W REFLEX MICROSCOPIC
BILIRUBIN URINE: NEGATIVE
Glucose, UA: NEGATIVE mg/dL
HGB URINE DIPSTICK: NEGATIVE
Ketones, ur: NEGATIVE mg/dL
Leukocytes, UA: NEGATIVE
NITRITE: NEGATIVE
PH: 6 (ref 5.0–8.0)
Protein, ur: NEGATIVE mg/dL
SPECIFIC GRAVITY, URINE: 1.016 (ref 1.005–1.030)

## 2016-11-19 LAB — PROTIME-INR
INR: 0.98
Prothrombin Time: 12.9 seconds (ref 11.4–15.2)

## 2016-11-19 LAB — APTT: APTT: 30 s (ref 24–36)

## 2016-11-19 MED ORDER — PROPOFOL 10 MG/ML IV BOLUS
INTRAVENOUS | Status: AC
  Filled 2016-11-19: qty 20

## 2016-11-19 MED ORDER — MIDAZOLAM HCL 2 MG/2ML IJ SOLN
INTRAMUSCULAR | Status: AC
  Filled 2016-11-19: qty 2

## 2016-11-19 MED ORDER — FENTANYL CITRATE (PF) 100 MCG/2ML IJ SOLN
INTRAMUSCULAR | Status: AC
  Filled 2016-11-19: qty 2

## 2016-11-19 NOTE — Progress Notes (Signed)
PCP - Dr. Joeseph Amor Cardiologist - denies  EKG - 11/19/2016  Stress Test - 2016, low risk ECHO - 2008  Pt does not take aspirin or any blood thinners regularly.   Patient denies shortness of breath, fever, cough and chest pain at PAT appointment  Patient verbalized understanding of instructions that were given to them at the PAT appointment. Patient was also instructed that they will need to review over the PAT instructions again at home before surgery.

## 2016-11-20 NOTE — Progress Notes (Signed)
Anesthesia Chart Review:  Pt is a 66 year old male scheduled for C4-5, C5-6 ACDF on 11/21/2016 with Rodell Perna, MD  - PCP is Sharilyn Sites, MD  PMH includes:  HTN, anemia, GERD.  Never smoker. BMI 27  Medications include: Amlodipine  BP (!) 143/86 Comment: rechecked per patients request  Pulse 62   Temp 37.1 C   Resp 20   Ht 5\' 11"  (1.803 m)   Wt 192 lb 1.6 oz (87.1 kg)   SpO2 96%   BMI 26.79 kg/m   Preoperative labs reviewed.    EKG 11/19/16: Sinus bradycardia (55 bpm). Minimal voltage criteria for LVH, may be normal variant  Nuclear stress test 02/12/14 (done for chest pain; GI etiology suspected:  1. No scintigraphic evidence of prior infarction or pharmacologically ischemia. 2. Normal left ventricular wall motion. 3. Left ventricular ejection fraction 67% 4. Low-risk stress test findings  If no changes, I anticipate pt can proceed with surgery as scheduled.   Willeen Cass, FNP-BC [facility]/Anesthesiology Phone: [phone] 11/20/2016 12:17 PM

## 2016-11-21 ENCOUNTER — Encounter (HOSPITAL_COMMUNITY)
Admission: RE | Disposition: A | Discharge status: Home / Self Care | Payer: Self-pay | Source: Ambulatory Visit | Attending: Orthopaedic Surgery

## 2016-11-21 ENCOUNTER — Ambulatory Visit (HOSPITAL_COMMUNITY): Payer: Medicare Other | Admitting: Emergency Medicine

## 2016-11-21 ENCOUNTER — Ambulatory Visit (HOSPITAL_COMMUNITY): Payer: Medicare Other | Admitting: Anesthesiology

## 2016-11-21 ENCOUNTER — Ambulatory Visit (HOSPITAL_COMMUNITY): Payer: Medicare Other

## 2016-11-21 ENCOUNTER — Observation Stay (HOSPITAL_COMMUNITY)
Admission: RE | Admit: 2016-11-21 | Discharge: 2016-11-22 | Disposition: A | Discharge status: Home / Self Care | Payer: Medicare Other | Source: Ambulatory Visit | Attending: Orthopaedic Surgery | Admitting: Orthopaedic Surgery

## 2016-11-21 DIAGNOSIS — Z419 Encounter for procedure for purposes other than remedying health state, unspecified: Secondary | ICD-10-CM

## 2016-11-21 DIAGNOSIS — N411 Chronic prostatitis: Secondary | ICD-10-CM | POA: Insufficient documentation

## 2016-11-21 DIAGNOSIS — Z862 Personal history of diseases of the blood and blood-forming organs and certain disorders involving the immune mechanism: Secondary | ICD-10-CM | POA: Insufficient documentation

## 2016-11-21 DIAGNOSIS — M4802 Spinal stenosis, cervical region: Principal | ICD-10-CM | POA: Insufficient documentation

## 2016-11-21 DIAGNOSIS — Z79899 Other long term (current) drug therapy: Secondary | ICD-10-CM | POA: Diagnosis not present

## 2016-11-21 DIAGNOSIS — K219 Gastro-esophageal reflux disease without esophagitis: Secondary | ICD-10-CM | POA: Diagnosis not present

## 2016-11-21 DIAGNOSIS — M4712 Other spondylosis with myelopathy, cervical region: Secondary | ICD-10-CM | POA: Diagnosis not present

## 2016-11-21 DIAGNOSIS — I10 Essential (primary) hypertension: Secondary | ICD-10-CM | POA: Diagnosis not present

## 2016-11-21 DIAGNOSIS — Z888 Allergy status to other drugs, medicaments and biological substances status: Secondary | ICD-10-CM | POA: Diagnosis not present

## 2016-11-21 DIAGNOSIS — Z833 Family history of diabetes mellitus: Secondary | ICD-10-CM | POA: Insufficient documentation

## 2016-11-21 DIAGNOSIS — Z800 Family history of malignant neoplasm of digestive organs: Secondary | ICD-10-CM | POA: Diagnosis not present

## 2016-11-21 DIAGNOSIS — Z8249 Family history of ischemic heart disease and other diseases of the circulatory system: Secondary | ICD-10-CM | POA: Insufficient documentation

## 2016-11-21 DIAGNOSIS — Z01810 Encounter for preprocedural cardiovascular examination: Secondary | ICD-10-CM | POA: Insufficient documentation

## 2016-11-21 DIAGNOSIS — M47812 Spondylosis without myelopathy or radiculopathy, cervical region: Secondary | ICD-10-CM | POA: Diagnosis not present

## 2016-11-21 DIAGNOSIS — Z01812 Encounter for preprocedural laboratory examination: Secondary | ICD-10-CM | POA: Diagnosis not present

## 2016-11-21 DIAGNOSIS — D649 Anemia, unspecified: Secondary | ICD-10-CM | POA: Diagnosis not present

## 2016-11-21 DIAGNOSIS — M4322 Fusion of spine, cervical region: Secondary | ICD-10-CM | POA: Diagnosis not present

## 2016-11-21 HISTORY — PX: ANTERIOR CERVICAL DECOMP/DISCECTOMY FUSION: SHX1161

## 2016-11-21 SURGERY — ANTERIOR CERVICAL DECOMPRESSION/DISCECTOMY FUSION 2 LEVELS
Anesthesia: General | Site: Neck

## 2016-11-21 MED ORDER — SUGAMMADEX SODIUM 200 MG/2ML IV SOLN
INTRAVENOUS | Status: AC
  Filled 2016-11-21: qty 2

## 2016-11-21 MED ORDER — ROCURONIUM BROMIDE 10 MG/ML (PF) SYRINGE
PREFILLED_SYRINGE | INTRAVENOUS | Status: AC
  Filled 2016-11-21: qty 5

## 2016-11-21 MED ORDER — SUGAMMADEX SODIUM 200 MG/2ML IV SOLN
INTRAVENOUS | Status: DC | PRN
  Administered 2016-11-21: 174.2 mg via INTRAVENOUS

## 2016-11-21 MED ORDER — EPHEDRINE 5 MG/ML INJ
INTRAVENOUS | Status: AC
  Filled 2016-11-21: qty 10

## 2016-11-21 MED ORDER — METHOCARBAMOL 500 MG PO TABS
500.0000 mg | ORAL_TABLET | Freq: Four times a day (QID) | ORAL | Status: DC | PRN
  Administered 2016-11-21 – 2016-11-22 (×2): 500 mg via ORAL
  Filled 2016-11-21 (×2): qty 1

## 2016-11-21 MED ORDER — THROMBIN 5000 UNITS EX SOLR
CUTANEOUS | Status: DC | PRN
  Administered 2016-11-21: 5000 [IU] via TOPICAL

## 2016-11-21 MED ORDER — OXYCODONE-ACETAMINOPHEN 5-325 MG PO TABS: 1.0000 | ORAL_TABLET | Freq: Four times a day (QID) | ORAL | 0 refills | Status: DC | PRN

## 2016-11-21 MED ORDER — HYDROMORPHONE HCL 1 MG/ML IJ SOLN
INTRAMUSCULAR | Status: AC
  Filled 2016-11-21: qty 1

## 2016-11-21 MED ORDER — LIDOCAINE 2% (20 MG/ML) 5 ML SYRINGE
INTRAMUSCULAR | Status: AC
  Filled 2016-11-21: qty 10

## 2016-11-21 MED ORDER — BUPIVACAINE-EPINEPHRINE (PF) 0.25% -1:200000 IJ SOLN
INTRAMUSCULAR | Status: AC
  Filled 2016-11-21: qty 30

## 2016-11-21 MED ORDER — METHOCARBAMOL 500 MG PO TABS
ORAL_TABLET | ORAL | Status: AC
  Filled 2016-11-21: qty 1

## 2016-11-21 MED ORDER — THROMBIN (RECOMBINANT) 5000 UNITS EX SOLR
CUTANEOUS | Status: AC
  Filled 2016-11-21: qty 5000

## 2016-11-21 MED ORDER — SODIUM CHLORIDE 0.9 % IV SOLN: INTRAVENOUS | Status: DC

## 2016-11-21 MED ORDER — PROPOFOL 10 MG/ML IV BOLUS
INTRAVENOUS | Status: DC | PRN
  Administered 2016-11-21: 160 mg via INTRAVENOUS

## 2016-11-21 MED ORDER — DEXAMETHASONE SODIUM PHOSPHATE 10 MG/ML IJ SOLN
INTRAMUSCULAR | Status: AC
  Filled 2016-11-21: qty 1

## 2016-11-21 MED ORDER — OXYCODONE HCL 5 MG PO TABS
ORAL_TABLET | ORAL | Status: AC
  Filled 2016-11-21: qty 1

## 2016-11-21 MED ORDER — DEXAMETHASONE SODIUM PHOSPHATE 10 MG/ML IJ SOLN
INTRAMUSCULAR | Status: DC | PRN
  Administered 2016-11-21: 10 mg via INTRAVENOUS

## 2016-11-21 MED ORDER — PROPOFOL 10 MG/ML IV BOLUS
INTRAVENOUS | Status: AC
  Filled 2016-11-21: qty 20

## 2016-11-21 MED ORDER — DEXTROSE 5 % IV SOLN
500.0000 mg | Freq: Four times a day (QID) | INTRAVENOUS | Status: DC | PRN
  Filled 2016-11-21: qty 5

## 2016-11-21 MED ORDER — DOCUSATE SODIUM 100 MG PO CAPS
100.0000 mg | ORAL_CAPSULE | Freq: Two times a day (BID) | ORAL | Status: DC
  Administered 2016-11-21 – 2016-11-22 (×2): 100 mg via ORAL
  Filled 2016-11-21 (×2): qty 1

## 2016-11-21 MED ORDER — 0.9 % SODIUM CHLORIDE (POUR BTL) OPTIME
TOPICAL | Status: DC | PRN
  Administered 2016-11-21: 1000 mL

## 2016-11-21 MED ORDER — CEFAZOLIN SODIUM-DEXTROSE 2-4 GM/100ML-% IV SOLN
2.0000 g | INTRAVENOUS | Status: AC
  Administered 2016-11-21: 2 g via INTRAVENOUS
  Filled 2016-11-21: qty 100

## 2016-11-21 MED ORDER — LORATADINE 10 MG PO TABS
10.0000 mg | ORAL_TABLET | Freq: Every day | ORAL | Status: DC
  Administered 2016-11-22: 10 mg via ORAL
  Filled 2016-11-21: qty 1

## 2016-11-21 MED ORDER — OXYCODONE HCL 5 MG PO TABS: 5.0000 mg | ORAL_TABLET | ORAL | Status: DC | PRN

## 2016-11-21 MED ORDER — OXYCODONE HCL 5 MG/5ML PO SOLN: 5.0000 mg | Freq: Once | ORAL | Status: AC | PRN

## 2016-11-21 MED ORDER — PROMETHAZINE HCL 25 MG/ML IJ SOLN
6.2500 mg | INTRAMUSCULAR | Status: DC | PRN
Start: 2016-11-21 — End: 2016-11-21

## 2016-11-21 MED ORDER — MEPERIDINE HCL 25 MG/ML IJ SOLN
6.2500 mg | INTRAMUSCULAR | Status: DC | PRN
Start: 2016-11-21 — End: 2016-11-21

## 2016-11-21 MED ORDER — MIDAZOLAM HCL 2 MG/2ML IJ SOLN
INTRAMUSCULAR | Status: AC
  Filled 2016-11-21: qty 2

## 2016-11-21 MED ORDER — PHENYLEPHRINE 40 MCG/ML (10ML) SYRINGE FOR IV PUSH (FOR BLOOD PRESSURE SUPPORT)
PREFILLED_SYRINGE | INTRAVENOUS | Status: DC | PRN
  Administered 2016-11-21: 80 ug via INTRAVENOUS
  Administered 2016-11-21: 120 ug via INTRAVENOUS

## 2016-11-21 MED ORDER — ONDANSETRON HCL 4 MG/2ML IJ SOLN
INTRAMUSCULAR | Status: DC | PRN
  Administered 2016-11-21: 4 mg via INTRAVENOUS

## 2016-11-21 MED ORDER — BUPIVACAINE-EPINEPHRINE 0.25% -1:200000 IJ SOLN
INTRAMUSCULAR | Status: DC | PRN
  Administered 2016-11-21: 6 mL

## 2016-11-21 MED ORDER — MIDAZOLAM HCL 5 MG/5ML IJ SOLN
INTRAMUSCULAR | Status: DC | PRN
  Administered 2016-11-21: 2 mg via INTRAVENOUS

## 2016-11-21 MED ORDER — LIDOCAINE 2% (20 MG/ML) 5 ML SYRINGE
INTRAMUSCULAR | Status: DC | PRN
  Administered 2016-11-21 (×2): 60 mg via INTRAVENOUS

## 2016-11-21 MED ORDER — FENTANYL CITRATE (PF) 250 MCG/5ML IJ SOLN
INTRAMUSCULAR | Status: DC | PRN
  Administered 2016-11-21 (×4): 50 ug via INTRAVENOUS

## 2016-11-21 MED ORDER — CEFAZOLIN SODIUM-DEXTROSE 1-4 GM/50ML-% IV SOLN
1.0000 g | Freq: Three times a day (TID) | INTRAVENOUS | Status: AC
  Administered 2016-11-21: 1 g via INTRAVENOUS
  Filled 2016-11-21: qty 50

## 2016-11-21 MED ORDER — HEMOSTATIC AGENTS (NO CHARGE) OPTIME
TOPICAL | Status: DC | PRN
  Administered 2016-11-21: 1 via TOPICAL

## 2016-11-21 MED ORDER — METHOCARBAMOL 500 MG PO TABS
500.0000 mg | ORAL_TABLET | Freq: Four times a day (QID) | ORAL | 0 refills | Status: DC | PRN
Start: 2016-11-21 — End: 2018-03-16

## 2016-11-21 MED ORDER — CHLORHEXIDINE GLUCONATE 4 % EX LIQD: 60.0000 mL | Freq: Once | CUTANEOUS | Status: DC

## 2016-11-21 MED ORDER — AMLODIPINE BESYLATE 10 MG PO TABS
10.0000 mg | ORAL_TABLET | Freq: Every day | ORAL | Status: DC
  Administered 2016-11-22: 10 mg via ORAL
  Filled 2016-11-21: qty 1

## 2016-11-21 MED ORDER — ROCURONIUM BROMIDE 10 MG/ML (PF) SYRINGE
PREFILLED_SYRINGE | INTRAVENOUS | Status: DC | PRN
  Administered 2016-11-21 (×2): 20 mg via INTRAVENOUS
  Administered 2016-11-21: 50 mg via INTRAVENOUS
  Administered 2016-11-21: 10 mg via INTRAVENOUS

## 2016-11-21 MED ORDER — OXYCODONE HCL 5 MG PO TABS
5.0000 mg | ORAL_TABLET | Freq: Once | ORAL | Status: AC | PRN
  Administered 2016-11-21: 5 mg via ORAL

## 2016-11-21 MED ORDER — POLYETHYLENE GLYCOL 3350 17 G PO PACK: 17.0000 g | PACK | Freq: Every day | ORAL | Status: DC | PRN

## 2016-11-21 MED ORDER — EPHEDRINE SULFATE-NACL 50-0.9 MG/10ML-% IV SOSY
PREFILLED_SYRINGE | INTRAVENOUS | Status: DC | PRN
  Administered 2016-11-21: 5 mg via INTRAVENOUS

## 2016-11-21 MED ORDER — OXYCODONE HCL 5 MG PO TABS
10.0000 mg | ORAL_TABLET | ORAL | Status: DC | PRN
  Administered 2016-11-21 – 2016-11-22 (×4): 10 mg via ORAL
  Filled 2016-11-21 (×4): qty 2

## 2016-11-21 MED ORDER — LACTATED RINGERS IV SOLN
INTRAVENOUS | Status: DC | PRN
  Administered 2016-11-21 (×2): via INTRAVENOUS

## 2016-11-21 MED ORDER — ONDANSETRON HCL 4 MG/2ML IJ SOLN
INTRAMUSCULAR | Status: AC
  Filled 2016-11-21: qty 2

## 2016-11-21 MED ORDER — PHENYLEPHRINE 40 MCG/ML (10ML) SYRINGE FOR IV PUSH (FOR BLOOD PRESSURE SUPPORT)
PREFILLED_SYRINGE | INTRAVENOUS | Status: AC
  Filled 2016-11-21: qty 10

## 2016-11-21 MED ORDER — FENTANYL CITRATE (PF) 250 MCG/5ML IJ SOLN
INTRAMUSCULAR | Status: AC
  Filled 2016-11-21: qty 5

## 2016-11-21 MED ORDER — AMLODIPINE BESYLATE 10 MG PO TABS
10.0000 mg | ORAL_TABLET | Freq: Every day | ORAL | Status: DC
  Filled 2016-11-21: qty 1

## 2016-11-21 MED ORDER — HYDROMORPHONE HCL 1 MG/ML IJ SOLN
0.5000 mg | INTRAMUSCULAR | Status: DC | PRN
  Administered 2016-11-21: 0.5 mg via INTRAVENOUS
  Filled 2016-11-21: qty 0.5

## 2016-11-21 MED ORDER — HYDROMORPHONE HCL 1 MG/ML IJ SOLN
0.2500 mg | INTRAMUSCULAR | Status: DC | PRN
Start: 2016-11-21 — End: 2016-11-21
  Administered 2016-11-21 (×3): 0.5 mg via INTRAVENOUS

## 2016-11-21 MED ORDER — COQ-10 100 MG PO CAPS: 1.0000 | ORAL_CAPSULE | Freq: Every day | ORAL | Status: DC

## 2016-11-21 SURGICAL SUPPLY — 57 items
BENZOIN TINCTURE PRP APPL 2/3 (GAUZE/BANDAGES/DRESSINGS) ×3 IMPLANT
BIT DRILL SRG 14X2.2XFLT CHK (BIT) ×1 IMPLANT
BIT DRL SRG 14X2.2XFLT CHK (BIT) ×1
BLADE CLIPPER SURG (BLADE) IMPLANT
BONE CERV LORDOTIC 14.5X12X8 (Bone Implant) ×6 IMPLANT
BUR ROUND FLUTED 4 SOFT TCH (BURR) ×2 IMPLANT
BUR ROUND FLUTED 4MM SOFT TCH (BURR) ×1
CLOSURE STERI-STRIP 1/2X4 (GAUZE/BANDAGES/DRESSINGS) ×1
CLOSURE WOUND 1/2 X4 (GAUZE/BANDAGES/DRESSINGS) ×1
CLSR STERI-STRIP ANTIMIC 1/2X4 (GAUZE/BANDAGES/DRESSINGS) ×2 IMPLANT
COLLAR CERV LO CONTOUR FIRM DE (SOFTGOODS) ×3 IMPLANT
CORDS BIPOLAR (ELECTRODE) ×3 IMPLANT
COVER MAYO STAND STRL (DRAPES) ×9 IMPLANT
COVER SURGICAL LIGHT HANDLE (MISCELLANEOUS) ×3 IMPLANT
CRADLE DONUT ADULT HEAD (MISCELLANEOUS) ×3 IMPLANT
DRAPE C-ARM 42X72 X-RAY (DRAPES) ×3 IMPLANT
DRAPE HALF SHEET 40X57 (DRAPES) ×3 IMPLANT
DRAPE MICROSCOPE LEICA (MISCELLANEOUS) ×3 IMPLANT
DRILL BIT SKYLINE 14MM (BIT) ×2
DURAPREP 6ML APPLICATOR 50/CS (WOUND CARE) ×3 IMPLANT
ELECT COATED BLADE 2.86 ST (ELECTRODE) ×6 IMPLANT
ELECT REM PT RETURN 9FT ADLT (ELECTROSURGICAL) ×3
ELECTRODE REM PT RTRN 9FT ADLT (ELECTROSURGICAL) ×1 IMPLANT
EVACUATOR 1/8 PVC DRAIN (DRAIN) ×6 IMPLANT
GAUZE SPONGE 4X4 12PLY STRL (GAUZE/BANDAGES/DRESSINGS) ×3 IMPLANT
GLOVE BIOGEL PI IND STRL 8 (GLOVE) ×2 IMPLANT
GLOVE BIOGEL PI INDICATOR 8 (GLOVE) ×4
GLOVE ORTHO TXT STRL SZ7.5 (GLOVE) ×6 IMPLANT
GOWN STRL REUS W/ TWL LRG LVL3 (GOWN DISPOSABLE) ×1 IMPLANT
GOWN STRL REUS W/ TWL XL LVL3 (GOWN DISPOSABLE) ×1 IMPLANT
GOWN STRL REUS W/TWL 2XL LVL3 (GOWN DISPOSABLE) ×3 IMPLANT
GOWN STRL REUS W/TWL LRG LVL3 (GOWN DISPOSABLE) ×2
GOWN STRL REUS W/TWL XL LVL3 (GOWN DISPOSABLE) ×2
HEAD HALTER (SOFTGOODS) ×3 IMPLANT
HEMOSTAT SURGICEL 2X14 (HEMOSTASIS) IMPLANT
KIT BASIN OR (CUSTOM PROCEDURE TRAY) ×3 IMPLANT
KIT ROOM TURNOVER OR (KITS) ×3 IMPLANT
MANIFOLD NEPTUNE II (INSTRUMENTS) IMPLANT
NEEDLE 25GX 5/8IN NON SAFETY (NEEDLE) ×3 IMPLANT
NS IRRIG 1000ML POUR BTL (IV SOLUTION) ×3 IMPLANT
PACK ORTHO CERVICAL (CUSTOM PROCEDURE TRAY) ×3 IMPLANT
PAD ARMBOARD 7.5X6 YLW CONV (MISCELLANEOUS) ×6 IMPLANT
PATTIES SURGICAL .5 X.5 (GAUZE/BANDAGES/DRESSINGS) ×3 IMPLANT
PIN TEMP SKYLINE THREADED (PIN) ×3 IMPLANT
PLATE TWO LEVEL SKYLINE 30MM (Plate) ×3 IMPLANT
RESTRAINT LIMB HOLDER UNIV (RESTRAINTS) ×3 IMPLANT
SCREW VAR SELF TAP SKYLINE 14M (Screw) ×18 IMPLANT
SPONGE LAP 4X18 X RAY DECT (DISPOSABLE) ×6 IMPLANT
STRIP CLOSURE SKIN 1/2X4 (GAUZE/BANDAGES/DRESSINGS) ×2 IMPLANT
SURGIFLO W/THROMBIN 8M KIT (HEMOSTASIS) ×3 IMPLANT
SUT BONE WAX W31G (SUTURE) ×3 IMPLANT
SUT VIC AB 3-0 X1 27 (SUTURE) ×3 IMPLANT
SUT VICRYL 4-0 PS2 18IN ABS (SUTURE) ×6 IMPLANT
TAPE STRIPS DRAPE STRL (GAUZE/BANDAGES/DRESSINGS) ×3 IMPLANT
TOWEL OR 17X24 6PK STRL BLUE (TOWEL DISPOSABLE) ×3 IMPLANT
TOWEL OR 17X26 10 PK STRL BLUE (TOWEL DISPOSABLE) ×3 IMPLANT
TRAY FOLEY CATH SILVER 16FR (SET/KITS/TRAYS/PACK) IMPLANT

## 2016-11-21 NOTE — Anesthesia Procedure Notes (Addendum)
Procedure Name: Intubation Date/Time: 11/21/2016 7:48 AM Performed by: Freddie Breech, CRNA Pre-anesthesia Checklist: Patient identified, Emergency Drugs available, Suction available and Patient being monitored Patient Re-evaluated:Patient Re-evaluated prior to induction Oxygen Delivery Method: Circle System Utilized Preoxygenation: Pre-oxygenation with 100% oxygen Induction Type: IV induction Ventilation: Mask ventilation without difficulty Laryngoscope Size: Glidescope and 4 Grade View: Grade II Tube type: Oral Tube size: 7.0 mm Number of attempts: 1 Airway Equipment and Method: Stylet,  Oral airway and Video-laryngoscopy Placement Confirmation: positive ETCO2 and breath sounds checked- equal and bilateral (ETT placed with video laryngoscopy) Secured at: 23 cm Tube secured with: Tape Dental Injury: Teeth and Oropharynx as per pre-operative assessment

## 2016-11-21 NOTE — Discharge Instructions (Signed)
ORTHOPEDIC DISCHARGE INSTRUCTIONS  -OK TO SHOWER 5 DAYS POSTOP WITH EXTRA COLLAR ON.  DO NOT APPLY ANY CREAMS OR OINTMENTS TO INCISION.    -DO NOT BEND NECK  -COLLAR MUST BE ON AT ALL TIMES   -NO DRIVING, LIFTING, OR STRENUOUS ACTIVITY.    -IF YOU HAVE ANY ISSUES WITH BREATHING OR SWALLOWING YOU SHOULD GO IMMEDIATELY TO THE EMERGENCY ROOM FOR EVALUATION.

## 2016-11-21 NOTE — Transfer of Care (Signed)
Immediate Anesthesia Transfer of Care Note  Patient: Stephen Kramer  Procedure(s) Performed: C4-5, C5-6 ANTERIOR CERVICAL DISCECTOMY AND FUSION, ALLOGRAFT, PLATE (N/A Neck)  Patient Location: PACU  Anesthesia Type:General  Level of Consciousness: drowsy and patient cooperative  Airway & Oxygen Therapy: Patient Spontanous Breathing and Patient connected to face mask oxygen  Post-op Assessment: Report given to RN and Post -op Vital signs reviewed and stable  Post vital signs: Reviewed and stable  Last Vitals:  Vitals:   11/21/16 0557 11/21/16 1016  BP: 138/79 (!) (P) 147/72  Pulse: 62   Resp: 20   Temp: 36.9 C (P) 36.7 C  SpO2: 100%     Last Pain:  Vitals:   11/21/16 0627  TempSrc:   PainSc: 2          Complications: No apparent anesthesia complications

## 2016-11-21 NOTE — Progress Notes (Signed)
reportrt given to caroline rn as caregiver

## 2016-11-21 NOTE — Interval H&P Note (Signed)
History and Physical Interval Note:  11/21/2016 7:28 AM  Stephen Kramer  has presented today for surgery, with the diagnosis of Cervical Spondylosis, Stenosis  The various methods of treatment have been discussed with the patient and family. After consideration of risks, benefits and other options for treatment, the patient has consented to  Procedure(s): C4-5, C5-6 ANTERIOR CERVICAL DISCECTOMY AND FUSION, ALLOGRAFT, PLATE (N/A) as a surgical intervention .  The patient's history has been reviewed, patient examined, no change in status, stable for surgery.  I have reviewed the patient's chart and labs.  Questions were answered to the patient's satisfaction.     Marybelle Killings

## 2016-11-21 NOTE — Anesthesia Preprocedure Evaluation (Signed)
Anesthesia Evaluation  Patient identified by MRN, date of birth, ID band Patient awake    Reviewed: Allergy & Precautions, NPO status , Patient's Chart, lab work & pertinent test results  Airway Mallampati: II  TM Distance: >3 FB Neck ROM: Full    Dental no notable dental hx.    Pulmonary neg pulmonary ROS,    Pulmonary exam normal breath sounds clear to auscultation       Cardiovascular hypertension, Pt. on medications negative cardio ROS Normal cardiovascular exam Rhythm:Regular Rate:Normal     Neuro/Psych  Headaches, negative neurological ROS  negative psych ROS   GI/Hepatic negative GI ROS, Neg liver ROS, GERD  ,  Endo/Other  negative endocrine ROS  Renal/GU negative Renal ROS  negative genitourinary   Musculoskeletal negative musculoskeletal ROS (+) Arthritis , Osteoarthritis,    Abdominal   Peds negative pediatric ROS (+)  Hematology negative hematology ROS (+)   Anesthesia Other Findings   Reproductive/Obstetrics negative OB ROS                             Anesthesia Physical Anesthesia Plan  ASA: II  Anesthesia Plan: General   Post-op Pain Management:    Induction: Intravenous  PONV Risk Score and Plan: 2 and Ondansetron and Midazolam  Airway Management Planned: Oral ETT  Additional Equipment:   Intra-op Plan:   Post-operative Plan: Extubation in OR  Informed Consent: I have reviewed the patients History and Physical, chart, labs and discussed the procedure including the risks, benefits and alternatives for the proposed anesthesia with the patient or authorized representative who has indicated his/her understanding and acceptance.   Dental advisory given  Plan Discussed with: CRNA  Anesthesia Plan Comments:         Anesthesia Quick Evaluation

## 2016-11-21 NOTE — Anesthesia Postprocedure Evaluation (Signed)
Anesthesia Post Note  Patient: Stephen Kramer  Procedure(s) Performed: C4-5, C5-6 ANTERIOR CERVICAL DISCECTOMY AND FUSION, ALLOGRAFT, PLATE (N/A Neck)     Patient location during evaluation: PACU Anesthesia Type: General Level of consciousness: awake and alert Pain management: pain level controlled Vital Signs Assessment: post-procedure vital signs reviewed and stable Respiratory status: spontaneous breathing, nonlabored ventilation and respiratory function stable Cardiovascular status: blood pressure returned to baseline and stable Postop Assessment: no apparent nausea or vomiting Anesthetic complications: no    Last Vitals:  Vitals:   11/21/16 1146 11/21/16 1149  BP: 135/71 132/78  Pulse: 84 81  Resp: 11 11  Temp:  (!) 36.3 C  SpO2: 96% 96%    Last Pain:  Vitals:   11/21/16 1149  TempSrc:   PainSc: 4     LLE Motor Response: Purposeful movement (11/21/16 1149) LLE Sensation: Full sensation (11/21/16 1149) RLE Motor Response: Purposeful movement (11/21/16 1149) RLE Sensation: Full sensation (11/21/16 1149)      Lynda Rainwater

## 2016-11-21 NOTE — Op Note (Signed)
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  Surgeon: Rodell Perna MD  Assistant: Benjiman Core PA-C medically necessary and present for the entire procedure  Anesthesia General orotracheal +6 cc Marcaine skin infiltration  Drains one Hemovac neck  EBL: Per anesthetic record  Implants: At skyline plate 30 mm with 14 mm screws x6.VG2 allografts 8 mm x2.  Procedure after induction of general anesthesia orotracheal intubation with a glide scope standard prepping and draping with arms tucked at the side wrist restraints headholder traction without weight preoperative Ancef prophylaxis and timeout procedure.  Neck was cleared with towels Betadine and Steri-Drape applied after DuraPrep had been initially used.  Thyroid sheets and drapes.  Incision was made at the midline stenting to the left platysma was divided blunt dissection down to the spurs were noted he had spurs at 4556 and also C6-7.  Initial needle was placed at C6-7 level and then we moved at C4-5 repeated needle placement short 25 with crosstable lateral C arm verification of the appropriate level.  Operative microscope was brought in after self-retaining retractors were placed discectomy was performed progressing back to the posterior spurs.  There is a 1-2 mm disc space posteriorly with overhanging spurs with bilateral foraminal narrowing with uncovertebral hypertrophy.  There was completely decompressed proximal sizes going up to an 8 mm gave a secure fit.  The graft was countersunk 1 mm.  Identical procedure repeated at C5-6 level which did not have as severe stenosis.  Surgi-Flo was used for hemostasis.  After the second graft was placed appropriate length plate was selected held with a spike retractor C-arm was brought in checked adjusted and then screws were filled checked under C-arm again and then all  screws were locked down with the tiny screwdriver.  Hemovacs placed in in that technique.  There is longus Coley bleeding on the left which was coagulated with the bipolar cautery.  Operative field was dry at the time of closure platysma was reapproximated 3-0 Vicryl 4-0 Vicryl subcuticular closure postop dressing and transferred to the recovery room where he was neurologically intact.

## 2016-11-21 NOTE — H&P (Signed)
Stephen Kramer is an 66 y.o. male.   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  Progressively worsening symptoms.  Failed conservative treatment.   Past Medical History:  Diagnosis Date  . Anemia   . Chest pain    a. 02/2014 MV: EF 67%, no ischemia/infarct.  . Chronic prostatitis   . GERD (gastroesophageal reflux disease)    not bad  . Hypertension   . Nocturia     Past Surgical History:  Procedure Laterality Date  . COLONOSCOPY    . COLONOSCOPY N/A 09/22/2016   Procedure: COLONOSCOPY;  Surgeon: Danie Binder, MD;  [facility];  Service: Endoscopy;  Laterality: N/A;  1:45pm  . HERNIA REPAIR     triple hernia surgery    Family History  Problem Relation Age of Onset  . Colon cancer Brother   . Hypertension Mother   . Diabetes Mother   . Hypertension Father    Social History:  reports that  has never smoked. he has never used smokeless tobacco. He reports that he drinks about 0.6 - 2.4 oz of alcohol per week. He reports that he does not use drugs.  Allergies:  Allergies  Allergen Reactions  . Feldene [Piroxicam] Diarrhea    Medications Prior to Admission  Medication Sig Dispense Refill  . amLODipine (NORVASC) 10 MG tablet Take 10 mg by mouth daily.  3  . cetirizine (ZYRTEC) 10 MG tablet Take 10 mg daily by mouth.    . Coenzyme Q10 (COQ-10) 100 MG CAPS Take 1 capsule daily by mouth.     . Glucos-Chond-Hyal Ac-Ca Fructo (MOVE FREE JOINT HEALTH ADVANCE) TABS Take 1 tablet by mouth daily.    . Misc Natural Products (PROSTATE HEALTH PO) Take 1 capsule daily by mouth.    . Multiple Vitamins-Minerals (CENTRUM SILVER PO) Take 1 tablet by mouth daily.       Results for orders placed or performed during the hospital encounter of 11/19/16 (from the past 48 hour(s))  Surgical pcr screen     Status: None   Collection Time: 11/19/16  1:48 PM  Result Value  Ref Range   MRSA, PCR NEGATIVE NEGATIVE   Staphylococcus aureus NEGATIVE NEGATIVE    Comment: (NOTE) The Xpert SA Assay (FDA approved for NASAL specimens in patients 51 years of age and older), is one component of a comprehensive surveillance program. It is not intended to diagnose infection nor to guide or monitor treatment.   APTT     Status: None   Collection Time: 11/19/16  2:00 PM  Result Value Ref Range   aPTT 30 24 - 36 seconds  CBC     Status: None   Collection Time: 11/19/16  2:00 PM  Result Value Ref Range   WBC 5.1 4.0 - 10.5 K/uL   RBC 4.98 4.22 - 5.81 MIL/uL   Hemoglobin 14.5 13.0 - 17.0 g/dL   HCT 43.9 39.0 - 52.0 %   MCV 88.2 78.0 - 100.0 fL   MCH 29.1 26.0 - 34.0 pg   MCHC 33.0 30.0 - 36.0 g/dL   RDW 14.5 11.5 - 15.5 %   Platelets 186 150 - 400 K/uL  Comprehensive metabolic panel     Status: Abnormal   Collection Time: 11/19/16  2:00 PM  Result Value Ref Range   Sodium 136 135 - 145 mmol/L   Potassium 3.6 3.5 - 5.1 mmol/L  Chloride 106 101 - 111 mmol/L   CO2 20 (L) 22 - 32 mmol/L   Glucose, Bld 96 65 - 99 mg/dL   BUN 9 6 - 20 mg/dL   Creatinine, Ser 0.91 0.61 - 1.24 mg/dL   Calcium 9.1 8.9 - 10.3 mg/dL   Total Protein 7.1 6.5 - 8.1 g/dL   Albumin 3.9 3.5 - 5.0 g/dL   AST 27 15 - 41 U/L   ALT 25 17 - 63 U/L   Alkaline Phosphatase 71 38 - 126 U/L   Total Bilirubin 2.0 (H) 0.3 - 1.2 mg/dL   GFR calc non Af Amer >60 >60 mL/min   GFR calc Af Amer >60 >60 mL/min    Comment: (NOTE) The eGFR has been calculated using the CKD EPI equation. This calculation has not been validated in all clinical situations. eGFR's persistently <60 mL/min signify possible Chronic Kidney Disease.    Anion gap 10 5 - 15  Protime-INR     Status: None   Collection Time: 11/19/16  2:00 PM  Result Value Ref Range   Prothrombin Time 12.9 11.4 - 15.2 seconds   INR 0.98   Urinalysis, Routine w reflex microscopic     Status: None   Collection Time: 11/19/16  2:37 PM  Result Value  Ref Range   Color, Urine YELLOW YELLOW   APPearance CLEAR CLEAR   Specific Gravity, Urine 1.016 1.005 - 1.030   pH 6.0 5.0 - 8.0   Glucose, UA NEGATIVE NEGATIVE mg/dL   Hgb urine dipstick NEGATIVE NEGATIVE   Bilirubin Urine NEGATIVE NEGATIVE   Ketones, ur NEGATIVE NEGATIVE mg/dL   Protein, ur NEGATIVE NEGATIVE mg/dL   Nitrite NEGATIVE NEGATIVE   Leukocytes, UA NEGATIVE NEGATIVE   No results found.  Review of Systems  Constitutional: Negative.   HENT: Negative.   Eyes: Negative.   Respiratory: Negative.   Cardiovascular: Negative.   Gastrointestinal: Negative.   Genitourinary: Negative.   Musculoskeletal: Positive for neck pain.  Neurological: Positive for tingling.  Psychiatric/Behavioral: Negative.     Blood pressure 138/79, pulse 62, temperature 98.4 F (36.9 C), temperature source Oral, resp. rate 20, SpO2 100 %. Physical Exam  Constitutional: He is oriented to person, place, and time. He appears well-developed. No distress.  HENT:  Head: Normocephalic and atraumatic.  Eyes: EOM are normal. Pupils are equal, round, and reactive to light.  Respiratory: No respiratory distress.  GI: He exhibits no distension.  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 It is not attached to the muscle by exam. Negative impingement of the shoulder. Decreased sensation radial side of his hand C6 distribution with severe brachial plexus tenderness on the right positive Spurling on the right. Brachial plexus tenderness on the left negative Spurling on the left negative were made. Upper extremity reflexes are 2+ and symmetrical no lower extremity clonus no hyperreflexia lower extremities. Good biceps triceps wrist flexion-extension strength.   Neurological: He is alert and oriented to person, place, and time.  Skin: Skin is warm and dry.  Psychiatric: He has a normal mood and  affect.     Assessment/Plan C4-5 and C5-6 HNP, stenosis  Will proceed with C4-5, C5-6 ANTERIOR CERVICAL DISCECTOMY AND FUSION, ALLOGRAFT, PLATE as scheduled.  Surgical procedure along with possible risks and complications discussed.  All questions answered and wishes to proceed.   Benjiman Core, PA-C 11/21/2016, 6:31 AM

## 2016-11-21 NOTE — Progress Notes (Signed)
PHARMACIST - PHYSICIAN ORDER COMMUNICATION  CONCERNING: P&T Medication Policy on Herbal Medications  DESCRIPTION:  This patient's order for: CoQ-10 caps  has been noted.  This product(s) is classified as an "herbal" or natural product. Due to a lack of definitive safety studies or FDA approval, nonstandard manufacturing practices, plus the potential risk of unknown drug-drug interactions while on inpatient medications, the Pharmacy and Therapeutics Committee does not permit the use of "herbal" or natural products of this type within [facility].   ACTION TAKEN: The pharmacy department is unable to verify this order at this time and your patient has been informed of this safety policy. Please reevaluate patient's clinical condition at discharge and address if the herbal or natural product(s) should be resumed at that time.

## 2016-11-22 DIAGNOSIS — K219 Gastro-esophageal reflux disease without esophagitis: Secondary | ICD-10-CM | POA: Diagnosis not present

## 2016-11-22 DIAGNOSIS — Z01810 Encounter for preprocedural cardiovascular examination: Secondary | ICD-10-CM | POA: Diagnosis not present

## 2016-11-22 DIAGNOSIS — M4802 Spinal stenosis, cervical region: Secondary | ICD-10-CM | POA: Diagnosis not present

## 2016-11-22 DIAGNOSIS — Z01812 Encounter for preprocedural laboratory examination: Secondary | ICD-10-CM | POA: Diagnosis not present

## 2016-11-22 DIAGNOSIS — M47812 Spondylosis without myelopathy or radiculopathy, cervical region: Secondary | ICD-10-CM | POA: Diagnosis not present

## 2016-11-22 DIAGNOSIS — Z833 Family history of diabetes mellitus: Secondary | ICD-10-CM | POA: Diagnosis not present

## 2016-11-22 DIAGNOSIS — Z888 Allergy status to other drugs, medicaments and biological substances status: Secondary | ICD-10-CM | POA: Diagnosis not present

## 2016-11-22 DIAGNOSIS — Z800 Family history of malignant neoplasm of digestive organs: Secondary | ICD-10-CM | POA: Diagnosis not present

## 2016-11-22 DIAGNOSIS — Z862 Personal history of diseases of the blood and blood-forming organs and certain disorders involving the immune mechanism: Secondary | ICD-10-CM | POA: Diagnosis not present

## 2016-11-22 DIAGNOSIS — I10 Essential (primary) hypertension: Secondary | ICD-10-CM | POA: Diagnosis not present

## 2016-11-22 DIAGNOSIS — Z8249 Family history of ischemic heart disease and other diseases of the circulatory system: Secondary | ICD-10-CM | POA: Diagnosis not present

## 2016-11-22 DIAGNOSIS — Z79899 Other long term (current) drug therapy: Secondary | ICD-10-CM | POA: Diagnosis not present

## 2016-11-22 DIAGNOSIS — N411 Chronic prostatitis: Secondary | ICD-10-CM | POA: Diagnosis not present

## 2016-11-22 NOTE — Progress Notes (Signed)
Subjective: Pt stable - pain ok - walking in hall - drain out   Objective: Vital signs in last 24 hours: Temp:  [97.4 F (36.3 C)-98.6 F (37 C)] 98.6 F (37 C) (11/17 0400) Pulse Rate:  [59-109] 59 (11/17 0814) Resp:  [10-29] 20 (11/17 0814) BP: (128-157)/(71-84) 136/80 (11/17 0814) SpO2:  [95 %-100 %] 98 % (11/17 0814)  Intake/Output from previous day: 11/16 0701 - 11/17 0700 In: 2500 [P.O.:600; I.V.:1500] Out: 245 [Drains:45; Blood:200] Intake/Output this shift: No intake/output data recorded.  Exam:  Sensation intact distally Dorsiflexion/Plantar flexion intact  Labs: Recent Labs    11/19/16 1400  HGB 14.5   Recent Labs    11/19/16 1400  WBC 5.1  RBC 4.98  HCT 43.9  PLT 186   Recent Labs    11/19/16 1400  NA 136  K 3.6  CL 106  CO2 20*  BUN 9  CREATININE 0.91  GLUCOSE 96  CALCIUM 9.1   Recent Labs    11/19/16 1400  INR 0.98    Assessment/Plan: Plan dc today   G Scott Dean 11/22/2016, 8:32 AM

## 2016-11-22 NOTE — Plan of Care (Signed)
  Progressing Activity: Ability to avoid complications of mobility impairment will improve 11/22/2016 0631 - Progressing by Jonnie Finner, RN Will remain free from falls 11/22/2016 0631 - Progressing by Jonnie Finner, RN Education: Ability to verbalize activity precautions or restrictions will improve 11/22/2016 0631 - Progressing by Jonnie Finner, RN Knowledge of the prescribed therapeutic regimen will improve 11/22/2016 0631 - Progressing by Jonnie Finner, RN Understanding of discharge needs will improve 11/22/2016 0631 - Progressing by Jonnie Finner, RN Physical Regulation: Ability to maintain clinical measurements within normal limits will improve 11/22/2016 0631 - Progressing by Jonnie Finner, RN Postoperative complications will be avoided or minimized 11/22/2016 0631 - Progressing by Jonnie Finner, RN Diagnostic test results will improve 11/22/2016 0631 - Progressing by Jonnie Finner, RN Pain Management: Pain level will decrease 11/22/2016 0631 - Progressing by Jonnie Finner, RN Health Behavior/Discharge Planning: Identification of resources available to assist in meeting health care needs will improve 11/22/2016 0631 - Progressing by Jonnie Finner, RN

## 2016-11-22 NOTE — Evaluation (Signed)
Physical Therapy Evaluation Patient Details Name: Stephen Kramer MRN: [id number] DOB: [birth year] Today's Date: 11/22/2016   History of Present Illness  Pt is a 66 y/o male s/p ACDF C4-C6. PMH including but not limited to HTN.  Clinical Impression  Pt presented supine in bed with HOB elevated, awake and willing to participate in therapy session. Pt's spouse present throughout. Prior to admission, pt reported that he was independent with all functional mobility and ADLs. Pt is retired. His wife continues to work but has taken next week off to assist pt as needed. Pt ambulated in hallway without use of an AD with supervision for safety, no instability or LOB. PT provided cervical handout to pt and reviewed precautions. No further acute PT needs identified at this time. PT signing off.     Follow Up Recommendations No PT follow up    Equipment Recommendations  None recommended by PT    Recommendations for Other Services       Precautions / Restrictions Precautions Precautions: Cervical Precaution Comments: PT provided pt with cervical handout Required Braces or Orthoses: Cervical Brace Cervical Brace: Soft collar Restrictions Weight Bearing Restrictions: No      Mobility  Bed Mobility Overal bed mobility: Needs Assistance Bed Mobility: Rolling;Sidelying to Sit Rolling: Supervision Sidelying to sit: Supervision       General bed mobility comments: supervision for safety  Transfers Overall transfer level: Modified independent Equipment used: None Transfers: Sit to/from Stand Sit to Stand: Modified independent (Device/Increase time)            Ambulation/Gait Ambulation/Gait assistance: Supervision Ambulation Distance (Feet): 500 Feet Assistive device: None Gait Pattern/deviations: Step-through pattern;Decreased stride length Gait velocity: decreased Gait velocity interpretation: Below normal speed for age/gender General Gait Details: no instability or LOB,  supervision for safety  Stairs            Wheelchair Mobility    Modified Rankin (Stroke Patients Only)       Balance Overall balance assessment: Needs assistance Sitting-balance support: Feet supported Sitting balance-Leahy Scale: Normal     Standing balance support: During functional activity;No upper extremity supported Standing balance-Leahy Scale: Good                               Pertinent Vitals/Pain Pain Assessment: Faces Faces Pain Scale: Hurts a little bit Pain Location: neck Pain Descriptors / Indicators: Sore Pain Intervention(s): Monitored during session;Repositioned    Home Living Family/patient expects to be discharged to:: Private residence Living Arrangements: Spouse/significant other Available Help at Discharge: Family Type of Home: House Home Access: Stairs to enter Entrance Stairs-Rails: None Entrance Stairs-Number of Steps: 2 Home Layout: One level Home Equipment: Cane - single point;Walker - 2 wheels;Crutches      Prior Function Level of Independence: Independent               Hand Dominance        Extremity/Trunk Assessment   Upper Extremity Assessment Upper Extremity Assessment: Overall WFL for tasks assessed    Lower Extremity Assessment Lower Extremity Assessment: Overall WFL for tasks assessed    Cervical / Trunk Assessment Cervical / Trunk Assessment: Other exceptions Cervical / Trunk Exceptions: s/p cervical sx  Communication   Communication: No difficulties  Cognition Arousal/Alertness: Awake/alert Behavior During Therapy: WFL for tasks assessed/performed Overall Cognitive Status: Within Functional Limits for tasks assessed  General Comments      Exercises     Assessment/Plan    PT Assessment Patent does not need any further PT services  PT Problem List         PT Treatment Interventions      PT Goals (Current goals can be  found in the Care Plan section)  Acute Rehab PT Goals Patient Stated Goal: return home today    Frequency     Barriers to discharge        Co-evaluation               AM-PAC PT "6 Clicks" Daily Activity  Outcome Measure Difficulty turning over in bed (including adjusting bedclothes, sheets and blankets)?: None Difficulty moving from lying on back to sitting on the side of the bed? : None Difficulty sitting down on and standing up from a chair with arms (e.g., wheelchair, bedside commode, etc,.)?: None Help needed moving to and from a bed to chair (including a wheelchair)?: None Help needed walking in hospital room?: None Help needed climbing 3-5 steps with a railing? : A Little 6 Click Score: 23    End of Session Equipment Utilized During Treatment: Cervical collar Activity Tolerance: Patient tolerated treatment well Patient left: with call bell/phone within reach;with family/visitor present;Other (comment)(sitting EOB) Nurse Communication: Mobility status PT Visit Diagnosis: Pain Pain - part of body: (neck)    Time: 9892-1194 PT Time Calculation (min) (ACUTE ONLY): 11 min   Charges:   PT Evaluation $PT Eval Low Complexity: 1 Low     PT G Codes:   PT G-Codes **NOT FOR INPATIENT CLASS** Functional Assessment Tool Used: AM-PAC 6 Clicks Basic Mobility;Clinical judgement Functional Limitation: Mobility: Walking and moving around Mobility: Walking and Moving Around Current Status (R7408): At least 1 percent but less than 20 percent impaired, limited or restricted Mobility: Walking and Moving Around Goal Status [phone]): 0 percent impaired, limited or restricted Mobility: Walking and Moving Around Discharge Status [phone]): At least 1 percent but less than 20 percent impaired, limited or restricted    [facility], [place], DPT [place] 11/22/2016, 9:13 AM

## 2016-11-22 NOTE — Progress Notes (Signed)
Orthopedic Tech Progress Note Patient Details:  Stephen Kramer 1950/02/17 [id number]  Ortho Devices Type of Ortho Device: Soft collar Ortho Device/Splint Interventions: Application   Stephen Kramer 11/22/2016, 1:31 PM

## 2016-11-22 NOTE — Progress Notes (Signed)
Patient alert and oriented, mae's well, voiding adequate amount of urine, swallowing without difficulty, no c/o pain at time of discharge. Patient discharged home with family. Script and discharged instructions given to patient. Patient and family stated understanding of instructions given. Patient has an appointment with Dr. Yates  

## 2016-11-24 ENCOUNTER — Encounter (HOSPITAL_COMMUNITY): Payer: Self-pay | Admitting: Orthopaedic Surgery

## 2016-12-01 ENCOUNTER — Telehealth (INDEPENDENT_AMBULATORY_CARE_PROVIDER_SITE_OTHER): Payer: Self-pay | Admitting: Orthopaedic Surgery

## 2016-12-01 ENCOUNTER — Encounter (HOSPITAL_COMMUNITY): Payer: Self-pay | Admitting: Orthopaedic Surgery

## 2016-12-01 NOTE — Telephone Encounter (Signed)
Patient would like to know if he can drive to his appt tomorrow 11/27 at 3:15. Please advise [phone]

## 2016-12-01 NOTE — Telephone Encounter (Signed)
IC patient and advised NO, do not drive to appt tomorrow.  LMVM

## 2016-12-02 ENCOUNTER — Ambulatory Visit (INDEPENDENT_AMBULATORY_CARE_PROVIDER_SITE_OTHER): Payer: Medicare Other

## 2016-12-02 ENCOUNTER — Ambulatory Visit (INDEPENDENT_AMBULATORY_CARE_PROVIDER_SITE_OTHER): Payer: Medicare Other | Admitting: Orthopaedic Surgery

## 2016-12-02 VITALS — BP 154/84 | HR 69

## 2016-12-02 DIAGNOSIS — M47812 Spondylosis without myelopathy or radiculopathy, cervical region: Secondary | ICD-10-CM

## 2016-12-02 DIAGNOSIS — Z981 Arthrodesis status: Secondary | ICD-10-CM

## 2016-12-02 NOTE — Progress Notes (Signed)
   Post-Op Visit Note   Patient: Stephen Kramer           Date of Birth: 1950/09/15           MRN: [id number] Visit Date: 12/02/2016 PCP: Sharilyn Sites, MD   Assessment & Plan: Post 2 level cervical fusion with some dysphasia problems particularly with liquids.  He has had some soreness in his shoulders but his radicular arm pain is gone.  Chief Complaint:  Chief Complaint  Patient presents with  . Neck - Routine Post Op   Visit Diagnoses:  1. Cervical spondylosis without myelopathy     Plan: Patient's incision looks good.  Extra collar covers given.  I will check him back again in 4 weeks for lateral flexion extension C-spine lateral x-ray and AP x-ray on return.  Follow-Up Instructions: Return in about 4 weeks (around 12/30/2016).   Orders:  Orders Placed This Encounter  Procedures  . XR Cervical Spine 2 or 3 views   No orders of the defined types were placed in this encounter.   Imaging: Xr Cervical Spine 2 Or 3 Views  Result Date: 12/02/2016 AP lateral cervical spine x-rays obtained which shows postop films from C4-5, C5-6 ACDF.  Good position of graft and screws. Impression satisfactory postop to level cervical fusion.   PMFS History: Patient Active Problem List   Diagnosis Date Noted  . Cervical spinal stenosis 11/21/2016  . Family history of colon cancer   . Lipoma of right upper extremity 07/03/2016  . Soft tissue mass 06/22/2016  . Spondylosis without myelopathy or radiculopathy, cervical region 06/22/2016  . Other headache syndrome 12/05/2015  . Pain in the chest   . Unstable angina ([place]) 02/11/2014  . Anemia 02/11/2014  . Chest pain 02/10/2014  . RHINITIS 06/26/2009  . Osteoarthritis 06/26/2009  . NOCTURIA 06/26/2009  . PROSTATITIS, CHRONIC, HX OF 04/14/2007   Past Medical History:  Diagnosis Date  . Anemia   . Chest pain    a. 02/2014 MV: EF 67%, no ischemia/infarct.  . Chronic prostatitis   . GERD (gastroesophageal reflux disease)    not  bad  . Hypertension   . Nocturia     Family History  Problem Relation Age of Onset  . Colon cancer Brother   . Hypertension Mother   . Diabetes Mother   . Hypertension Father     Past Surgical History:  Procedure Laterality Date  . ANTERIOR CERVICAL DECOMP/DISCECTOMY FUSION N/A 11/21/2016   Procedure: C4-5, C5-6 ANTERIOR CERVICAL DISCECTOMY AND FUSION, ALLOGRAFT, PLATE;  Surgeon: Marybelle Killings, MD;  Location: [place];  Service: Orthopedics;  Laterality: N/A;  . COLONOSCOPY    . COLONOSCOPY N/A 09/22/2016   Procedure: COLONOSCOPY;  Surgeon: Danie Binder, MD;  [facility];  Service: Endoscopy;  Laterality: N/A;  1:45pm  . HERNIA REPAIR     triple hernia surgery   Social History   Occupational History  . Not on file  Tobacco Use  . Smoking status: Never Smoker  . Smokeless tobacco: Never Used  Substance and Sexual Activity  . Alcohol use: Yes    Alcohol/week: 0.6 - 2.4 oz    Types: 1 - 4 Cans of beer per week  . Drug use: No  . Sexual activity: Yes

## 2016-12-03 ENCOUNTER — Encounter (INDEPENDENT_AMBULATORY_CARE_PROVIDER_SITE_OTHER): Payer: Self-pay | Admitting: Orthopaedic Surgery

## 2016-12-09 NOTE — Discharge Summary (Signed)
Patient ID: AVYUKTH BONTEMPO MRN: [id number] DOB/AGE: [birth year] 66 y.o.  Admit date: 11/21/2016 Discharge date: 12/09/2016  Admission Diagnoses:  Active Problems:   Cervical spinal stenosis   Discharge Diagnoses:  Active Problems:   Cervical spinal stenosis  status post Procedure(s): C4-5, C5-6 ANTERIOR CERVICAL DISCECTOMY AND FUSION, ALLOGRAFT, PLATE  Past Medical History:  Diagnosis Date  . Anemia   . Chest pain    a. 02/2014 MV: EF 67%, no ischemia/infarct.  . Chronic prostatitis   . GERD (gastroesophageal reflux disease)    not bad  . Hypertension   . Nocturia     Surgeries: Procedure(s): C4-5, C5-6 ANTERIOR CERVICAL DISCECTOMY AND FUSION, ALLOGRAFT, PLATE on 86/76/1951   Consultants:   Discharged Condition: Improved  Hospital Course: GURNIE DURIS is an 66 y.o. male who was admitted 11/21/2016 for operative treatment of cervical stenosis. Patient failed conservative treatments (please see the history and physical for the specifics) and had severe unremitting pain that affects sleep, daily activities and work/hobbies. After pre-op clearance, the patient was taken to the operating room on 11/21/2016 and underwent  Procedure(s): C4-5, C5-6 ANTERIOR CERVICAL DISCECTOMY AND FUSION, ALLOGRAFT, PLATE.    Patient was given perioperative antibiotics:  Anti-infectives (From admission, onward)   Start     Dose/Rate Route Frequency Ordered Stop   11/21/16 1600  ceFAZolin (ANCEF) IVPB 1 g/50 mL premix     1 g 100 mL/hr over 30 Minutes Intravenous Every 8 hours 11/21/16 1214 11/21/16 1630   11/21/16 0558  ceFAZolin (ANCEF) IVPB 2g/100 mL premix     2 g 200 mL/hr over 30 Minutes Intravenous On call to O.R. 11/21/16 9326 11/21/16 0759       Patient was given sequential compression devices and early ambulation to prevent DVT.   Patient benefited maximally from hospital stay and there were no complications. At the time of discharge, the patient was urinating/moving  their bowels without difficulty, tolerating a regular diet, pain is controlled with oral pain medications and they have been cleared by PT/OT.   Recent vital signs: No data found.   Recent laboratory studies: No results for input(s): WBC, HGB, HCT, PLT, NA, K, CL, CO2, BUN, CREATININE, GLUCOSE, INR, CALCIUM in the last 72 hours.  Invalid input(s): PT, 2   Discharge Medications:   Allergies as of 11/22/2016      Reactions   Feldene [piroxicam] Diarrhea      Medication List    TAKE these medications   amLODipine 10 MG tablet Commonly known as:  NORVASC Take 10 mg by mouth daily.   CENTRUM SILVER PO Take 1 tablet by mouth daily.   cetirizine 10 MG tablet Commonly known as:  ZYRTEC Take 10 mg daily by mouth.   CoQ-10 100 MG Caps Take 1 capsule daily by mouth.   methocarbamol 500 MG tablet Commonly known as:  ROBAXIN Take 1 tablet (500 mg total) every 6 (six) hours as needed by mouth for muscle spasms.   MOVE FREE JOINT HEALTH ADVANCE Tabs Take 1 tablet by mouth daily.   oxyCODONE-acetaminophen 5-325 MG tablet Commonly known as:  PERCOCET/ROXICET Take 1-2 tablets every 6 (six) hours as needed by mouth for severe pain.   PROSTATE HEALTH PO Take 1 capsule daily by mouth.       Diagnostic Studies: Dg Cervical Spine 2-3 Views  Result Date: 11/21/2016 CLINICAL DATA:  C4-C6 ACDF. EXAM: DG C-ARM 61-120 MIN; CERVICAL SPINE - 2-3 VIEW COMPARISON:  MRI cervical spine dated July 01, 2016. FINDINGS: AP and lateral intraoperative fluoroscopic x-rays demonstrate interval C4-C6 ACDF. No definite evidence of hardware complication. Alignment is normal. IMPRESSION: Interval C4-C6 ACDF. FLUOROSCOPY TIME:  18 seconds. C-arm fluoroscopic images were obtained intraoperatively and submitted for post operative interpretation. Electronically Signed   By: Titus Dubin M.D.   On: 11/21/2016 10:09   Dg C-arm 1-60 Min  Result Date: 11/21/2016 CLINICAL DATA:  C4-C6 ACDF. EXAM: DG C-ARM  61-120 MIN; CERVICAL SPINE - 2-3 VIEW COMPARISON:  MRI cervical spine dated July 01, 2016. FINDINGS: AP and lateral intraoperative fluoroscopic x-rays demonstrate interval C4-C6 ACDF. No definite evidence of hardware complication. Alignment is normal. IMPRESSION: Interval C4-C6 ACDF. FLUOROSCOPY TIME:  18 seconds. C-arm fluoroscopic images were obtained intraoperatively and submitted for post operative interpretation. Electronically Signed   By: Titus Dubin M.D.   On: 11/21/2016 10:09   Xr Cervical Spine 2 Or 3 Views  Result Date: 12/02/2016 AP lateral cervical spine x-rays obtained which shows postop films from C4-5, C5-6 ACDF.  Good position of graft and screws. Impression satisfactory postop to level cervical fusion.   Discharge Instructions    Call MD / Call 911   Complete by:  As directed    If you experience chest pain or shortness of breath, CALL 911 and be transported to the hospital emergency room.  If you develope a fever above 101 F, pus (white drainage) or increased drainage or redness at the wound, or calf pain, call your surgeon's office.   Constipation Prevention   Complete by:  As directed    Drink plenty of fluids.  Prune juice may be helpful.  You may use a stool softener, such as Colace (over the counter) 100 mg twice a day.  Use MiraLax (over the counter) for constipation as needed.   Diet - low sodium heart healthy   Complete by:  As directed    Discharge instructions   Complete by:  As directed    Increase activity slowly as tolerated   Complete by:  As directed       Follow-up Information    Schedule an appointment as soon as possible for a visit with Marybelle Killings, MD.   Specialty:  Orthopedic Surgery Why:  need return office visit one week postop Contact information: [place] [address] [phone]           Discharge Plan:  discharge to home  Disposition:     Signed: Benjiman Core for Rodell Perna MD 12/09/2016, 2:39  PM

## 2016-12-26 ENCOUNTER — Encounter (INDEPENDENT_AMBULATORY_CARE_PROVIDER_SITE_OTHER): Payer: Self-pay | Admitting: Orthopaedic Surgery

## 2016-12-26 ENCOUNTER — Ambulatory Visit (INDEPENDENT_AMBULATORY_CARE_PROVIDER_SITE_OTHER): Payer: Medicare Other

## 2016-12-26 ENCOUNTER — Ambulatory Visit (INDEPENDENT_AMBULATORY_CARE_PROVIDER_SITE_OTHER): Payer: Medicare Other | Admitting: Orthopaedic Surgery

## 2016-12-26 VITALS — BP 140/84 | HR 55 | Ht 71.0 in | Wt 193.0 lb

## 2016-12-26 DIAGNOSIS — Z981 Arthrodesis status: Secondary | ICD-10-CM

## 2016-12-26 NOTE — Progress Notes (Signed)
   Post-Op Visit Note   Patient: Stephen Kramer           Date of Birth: 11/15/1950           MRN: [id number] Visit Date: 12/26/2016 PCP: Sharilyn Sites, MD   Assessment & Plan: Post C4-5, C5-6 ACDF, allograft and plate.  Good relief of arm pain and numbness.  He still having some dysphasia problems and has to make sure he chews his food very carefully.  Chief Complaint:  Chief Complaint  Patient presents with  . Neck - Routine Post Op   Visit Diagnoses:  1. Status post cervical spinal fusion     Plan: He will keep his collar on for an additional 2 weeks and plan to recheck him in 3 weeks we will obtain lateral flexion-extension x-rays cervical spine views only.  Follow-Up Instructions: Return in about 3 weeks (around 01/16/2017).   Orders:  Orders Placed This Encounter  Procedures  . XR Cervical Spine 2 or 3 views   No orders of the defined types were placed in this encounter.   Imaging: Xr Cervical Spine 2 Or 3 Views  Result Date: 12/26/2016 Lateral flexion-extension x-ray and AP x-ray demonstrates trace motion at the C5-6 level.  No loosening of the screws.  No motion at the upper level.  Prevertebral swelling has decreased to 50%. Impression: Postop to level fusion with no motion at the upper level and 1-2 mm motion at the lower level.   PMFS History: Patient Active Problem List   Diagnosis Date Noted  . Cervical spinal stenosis 11/21/2016  . Family history of colon cancer   . Lipoma of right upper extremity 07/03/2016  . Soft tissue mass 06/22/2016  . Spondylosis without myelopathy or radiculopathy, cervical region 06/22/2016  . Other headache syndrome 12/05/2015  . Pain in the chest   . Unstable angina ([place]) 02/11/2014  . Anemia 02/11/2014  . Chest pain 02/10/2014  . RHINITIS 06/26/2009  . Osteoarthritis 06/26/2009  . NOCTURIA 06/26/2009  . PROSTATITIS, CHRONIC, HX OF 04/14/2007   Past Medical History:  Diagnosis Date  . Anemia   . Chest pain    a.  02/2014 MV: EF 67%, no ischemia/infarct.  . Chronic prostatitis   . GERD (gastroesophageal reflux disease)    not bad  . Hypertension   . Nocturia     Family History  Problem Relation Age of Onset  . Colon cancer Brother   . Hypertension Mother   . Diabetes Mother   . Hypertension Father     Past Surgical History:  Procedure Laterality Date  . ANTERIOR CERVICAL DECOMP/DISCECTOMY FUSION N/A 11/21/2016   Procedure: C4-5, C5-6 ANTERIOR CERVICAL DISCECTOMY AND FUSION, ALLOGRAFT, PLATE;  Surgeon: Marybelle Killings, MD;  Location: [place];  Service: Orthopedics;  Laterality: N/A;  . COLONOSCOPY    . COLONOSCOPY N/A 09/22/2016   Procedure: COLONOSCOPY;  Surgeon: Danie Binder, MD;  [facility];  Service: Endoscopy;  Laterality: N/A;  1:45pm  . HERNIA REPAIR     triple hernia surgery   Social History   Occupational History  . Not on file  Tobacco Use  . Smoking status: Never Smoker  . Smokeless tobacco: Never Used  Substance and Sexual Activity  . Alcohol use: Yes    Alcohol/week: 0.6 - 2.4 oz    Types: 1 - 4 Cans of beer per week  . Drug use: No  . Sexual activity: Yes

## 2017-01-16 ENCOUNTER — Ambulatory Visit (INDEPENDENT_AMBULATORY_CARE_PROVIDER_SITE_OTHER): Payer: Medicare Other | Admitting: Orthopaedic Surgery

## 2017-01-16 ENCOUNTER — Ambulatory Visit (INDEPENDENT_AMBULATORY_CARE_PROVIDER_SITE_OTHER): Payer: Medicare Other

## 2017-01-16 ENCOUNTER — Encounter (INDEPENDENT_AMBULATORY_CARE_PROVIDER_SITE_OTHER): Payer: Self-pay | Admitting: Orthopaedic Surgery

## 2017-01-16 VITALS — BP 147/85 | HR 64 | Ht 71.0 in | Wt 193.0 lb

## 2017-01-16 DIAGNOSIS — Z981 Arthrodesis status: Secondary | ICD-10-CM | POA: Insufficient documentation

## 2017-01-16 NOTE — Progress Notes (Signed)
   Post-Op Visit Note   Patient: Stephen Kramer           Date of Birth: 09-Oct-1950           MRN: [id number] Visit Date: 01/16/2017 PCP: Sharilyn Sites, MD   Assessment & Plan: Type II level cervical fusion he had wear his collar a few extra weeks but now has solid fusion with no motion.  He has progressive graft incorporation on plain radiographs.  Good relief of his numbness and tingling and he can discontinue his soft collar and return to see me as needed.  Chief Complaint:  Chief Complaint  Patient presents with  . Neck - Routine Post Op   Visit Diagnoses:  1. Status post cervical spinal fusion     Plan: He is released from care happy with the surgical result discontinue the collar he can resume his normal activities including yard work.  Follow-up here on a as needed basis.  Follow-Up Instructions: No Follow-up on file.   Orders:  Orders Placed This Encounter  Procedures  . XR Cervical Spine 2 or 3 views   No orders of the defined types were placed in this encounter.   Imaging: Xr Cervical Spine 2 Or 3 Views  Result Date: 01/16/2017 AP lateral and lateral flexion extension C-spine x-rays obtained and reviewed this shows no motion in satisfactory position of 2 level fusion at C4-5 and C5-6. Impression: Satisfactory 2 level fusion with no motion noted.   PMFS History: Patient Active Problem List   Diagnosis Date Noted  . Cervical spinal stenosis 11/21/2016  . Family history of colon cancer   . Lipoma of right upper extremity 07/03/2016  . Soft tissue mass 06/22/2016  . Spondylosis without myelopathy or radiculopathy, cervical region 06/22/2016  . Other headache syndrome 12/05/2015  . Pain in the chest   . Unstable angina ([place]) 02/11/2014  . Anemia 02/11/2014  . Chest pain 02/10/2014  . RHINITIS 06/26/2009  . Osteoarthritis 06/26/2009  . NOCTURIA 06/26/2009  . PROSTATITIS, CHRONIC, HX OF 04/14/2007   Past Medical History:  Diagnosis Date  . Anemia   .  Chest pain    a. 02/2014 MV: EF 67%, no ischemia/infarct.  . Chronic prostatitis   . GERD (gastroesophageal reflux disease)    not bad  . Hypertension   . Nocturia     Family History  Problem Relation Age of Onset  . Colon cancer Brother   . Hypertension Mother   . Diabetes Mother   . Hypertension Father     Past Surgical History:  Procedure Laterality Date  . ANTERIOR CERVICAL DECOMP/DISCECTOMY FUSION N/A 11/21/2016   Procedure: C4-5, C5-6 ANTERIOR CERVICAL DISCECTOMY AND FUSION, ALLOGRAFT, PLATE;  Surgeon: Marybelle Killings, MD;  Location: [facility];  Service: Orthopedics;  Laterality: N/A;  . COLONOSCOPY    . COLONOSCOPY N/A 09/22/2016   Procedure: COLONOSCOPY;  Surgeon: Danie Binder, MD;  [facility];  Service: Endoscopy;  Laterality: N/A;  1:45pm  . HERNIA REPAIR     triple hernia surgery   Social History   Occupational History  . Not on file  Tobacco Use  . Smoking status: Never Smoker  . Smokeless tobacco: Never Used  Substance and Sexual Activity  . Alcohol use: Yes    Alcohol/week: 0.6 - 2.4 oz    Types: 1 - 4 Cans of beer per week  . Drug use: No  . Sexual activity: Yes

## 2017-03-03 DIAGNOSIS — Z0000 Encounter for general adult medical examination without abnormal findings: Secondary | ICD-10-CM | POA: Diagnosis not present

## 2017-03-03 DIAGNOSIS — Z1389 Encounter for screening for other disorder: Secondary | ICD-10-CM | POA: Diagnosis not present

## 2017-03-03 DIAGNOSIS — Z0001 Encounter for general adult medical examination with abnormal findings: Secondary | ICD-10-CM | POA: Diagnosis not present

## 2017-03-03 DIAGNOSIS — E782 Mixed hyperlipidemia: Secondary | ICD-10-CM | POA: Diagnosis not present

## 2017-03-03 DIAGNOSIS — M5030 Other cervical disc degeneration, unspecified cervical region: Secondary | ICD-10-CM | POA: Diagnosis not present

## 2017-03-03 DIAGNOSIS — M4322 Fusion of spine, cervical region: Secondary | ICD-10-CM | POA: Diagnosis not present

## 2017-04-15 DIAGNOSIS — D1721 Benign lipomatous neoplasm of skin and subcutaneous tissue of right arm: Secondary | ICD-10-CM | POA: Diagnosis not present

## 2017-05-10 DIAGNOSIS — R05 Cough: Secondary | ICD-10-CM | POA: Diagnosis not present

## 2017-05-28 DIAGNOSIS — D1721 Benign lipomatous neoplasm of skin and subcutaneous tissue of right arm: Secondary | ICD-10-CM | POA: Diagnosis not present

## 2017-05-28 DIAGNOSIS — L918 Other hypertrophic disorders of the skin: Secondary | ICD-10-CM | POA: Diagnosis not present

## 2017-05-28 DIAGNOSIS — N400 Enlarged prostate without lower urinary tract symptoms: Secondary | ICD-10-CM | POA: Diagnosis not present

## 2017-05-28 DIAGNOSIS — D235 Other benign neoplasm of skin of trunk: Secondary | ICD-10-CM | POA: Diagnosis not present

## 2017-05-28 DIAGNOSIS — L723 Sebaceous cyst: Secondary | ICD-10-CM | POA: Diagnosis not present

## 2017-05-28 DIAGNOSIS — I10 Essential (primary) hypertension: Secondary | ICD-10-CM | POA: Diagnosis not present

## 2017-05-28 DIAGNOSIS — Z888 Allergy status to other drugs, medicaments and biological substances status: Secondary | ICD-10-CM | POA: Diagnosis not present

## 2017-05-29 DIAGNOSIS — L723 Sebaceous cyst: Secondary | ICD-10-CM | POA: Diagnosis not present

## 2017-05-29 DIAGNOSIS — D235 Other benign neoplasm of skin of trunk: Secondary | ICD-10-CM | POA: Diagnosis not present

## 2017-05-29 DIAGNOSIS — Z888 Allergy status to other drugs, medicaments and biological substances status: Secondary | ICD-10-CM | POA: Diagnosis not present

## 2017-05-29 DIAGNOSIS — D1721 Benign lipomatous neoplasm of skin and subcutaneous tissue of right arm: Secondary | ICD-10-CM | POA: Diagnosis not present

## 2017-05-29 DIAGNOSIS — L918 Other hypertrophic disorders of the skin: Secondary | ICD-10-CM | POA: Diagnosis not present

## 2017-05-29 DIAGNOSIS — N400 Enlarged prostate without lower urinary tract symptoms: Secondary | ICD-10-CM | POA: Diagnosis not present

## 2017-05-29 DIAGNOSIS — I10 Essential (primary) hypertension: Secondary | ICD-10-CM | POA: Diagnosis not present

## 2018-01-14 DIAGNOSIS — H2513 Age-related nuclear cataract, bilateral: Secondary | ICD-10-CM | POA: Diagnosis not present

## 2018-03-09 DIAGNOSIS — E7849 Other hyperlipidemia: Secondary | ICD-10-CM | POA: Diagnosis not present

## 2018-03-09 DIAGNOSIS — N411 Chronic prostatitis: Secondary | ICD-10-CM | POA: Diagnosis not present

## 2018-03-09 DIAGNOSIS — I10 Essential (primary) hypertension: Secondary | ICD-10-CM | POA: Diagnosis not present

## 2018-03-09 DIAGNOSIS — Z1389 Encounter for screening for other disorder: Secondary | ICD-10-CM | POA: Diagnosis not present

## 2018-03-09 DIAGNOSIS — M5030 Other cervical disc degeneration, unspecified cervical region: Secondary | ICD-10-CM | POA: Diagnosis not present

## 2018-03-09 DIAGNOSIS — Z0001 Encounter for general adult medical examination with abnormal findings: Secondary | ICD-10-CM | POA: Diagnosis not present

## 2018-03-16 ENCOUNTER — Encounter: Payer: Self-pay | Admitting: Neurology

## 2018-03-16 ENCOUNTER — Other Ambulatory Visit: Payer: Self-pay

## 2018-03-16 ENCOUNTER — Ambulatory Visit: Payer: Medicare Other | Admitting: Neurology

## 2018-03-16 VITALS — BP 150/85 | HR 61 | Resp 16 | Ht 71.0 in | Wt 191.0 lb

## 2018-03-16 DIAGNOSIS — R413 Other amnesia: Secondary | ICD-10-CM

## 2018-03-16 DIAGNOSIS — E538 Deficiency of other specified B group vitamins: Secondary | ICD-10-CM

## 2018-03-16 NOTE — Progress Notes (Signed)
Reason for visit: Memory disturbance  Referring physician: Dr. Elwin Kramer is a 68 y.o. male  History of present illness:  Stephen Kramer is a 61 year old right-handed black male with a history of some troubles with memory he claims has been present for about 2 or 3 years.  The patient reports some difficulty with remembering names for people and some word finding problems, he also indicates that he will go into a room in the house and cannot member why he went in there.  He otherwise has no problems keeping up with activities of daily living.  He is able to operate a motor vehicle without difficulty, he is managing his finances well, and he is able to keep up with his medications and appointments.  He claims that both of his parents had memory troubles as they got older.  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  He has not had issues with this since.  He did have a CT scan of the head previously that was unremarkable, no significant small vessel disease was noted.  The patient reports no numbness or weakness of the face, arms, legs.  He denies any problems with balance or difficulty controlling the bowels or the bladder, he does have some frequency secondary to prostate issues with the bladder.  The patient has had a several year history of occasional sweats and chills at night.  He has recently placed on Aricept taking 10 mg tablets, 1/2 tablet at night.  He is tolerating the medication fairly well so far.  He is sent to this office for an evaluation.  Past Medical History:  Diagnosis Date  . Anemia   . Chest pain    a. 02/2014 MV: EF 67%, no ischemia/infarct.  . Chronic prostatitis   . GERD (gastroesophageal reflux disease)    not bad  . Hypertension   . Nocturia     Past Surgical History:  Procedure Laterality Date  . ANTERIOR CERVICAL  DECOMP/DISCECTOMY FUSION N/A 11/21/2016   Procedure: C4-5, C5-6 ANTERIOR CERVICAL DISCECTOMY AND FUSION, ALLOGRAFT, PLATE;  Surgeon: Marybelle Killings, MD;  Location: [facility];  Service: Orthopedics;  Laterality: N/A;  . COLONOSCOPY    . COLONOSCOPY N/A 09/22/2016   Procedure: COLONOSCOPY;  Surgeon: Danie Binder, MD;  [facility];  Service: Endoscopy;  Laterality: N/A;  1:45pm  . HERNIA REPAIR     triple hernia surgery    Family History  Problem Relation Age of Onset  . Colon cancer Brother   . Hypertension Mother   . Diabetes Mother   . Hypertension Father     Social history:  reports that he has never smoked. He has never used smokeless tobacco. He reports current alcohol use of about 1.0 - 4.0 standard drinks of alcohol per week. He reports that he does not use drugs.  Medications:  Prior to Admission medications   Medication Sig Start Date End Date Taking? Authorizing Provider  amLODipine (NORVASC) 10 MG tablet Take 10 mg by mouth daily. 11/14/15  Yes [provider]  cetirizine (ZYRTEC) 10 MG tablet Take 10 mg daily by mouth.   Yes [provider]  Coenzyme Q10 (COQ-10) 100 MG CAPS Take 1 capsule daily by mouth.    Yes [provider]  donepezil (ARICEPT) 10 MG tablet TAKE HALF A TABLET BY MOUTH IN  THE EVENING 03/09/18  Yes [provider]  Glucos-Chond-Hyal Ac-Ca Fructo (MOVE FREE JOINT HEALTH ADVANCE) TABS Take 1 tablet by mouth daily.   Yes [provider]  Misc Natural Products (PROSTATE HEALTH PO) Take 1 capsule daily by mouth.   Yes [provider]  Multiple Vitamins-Minerals (CENTRUM SILVER PO) Take 1 tablet by mouth daily.    Yes [provider]      Allergies  Allergen Reactions  . Feldene [Piroxicam] Diarrhea    ROS:  Out of a complete 14 system review of symptoms, the patient complains only of the following symptoms, and all other reviewed systems are negative.  Memory problems Chills,  sweats  Blood pressure (!) 150/85, pulse 61, resp. rate 16, height 5\' 11"  (1.803 m), weight 191 lb (86.6 kg).  Physical Exam  General: The patient is alert and cooperative at the time of the examination.  Eyes: Pupils are equal, round, and reactive to light. Discs are flat bilaterally.  Neck: The neck is supple, no carotid bruits are noted.  Respiratory: The respiratory examination is clear.  Cardiovascular: The cardiovascular examination reveals a regular rate and rhythm, no obvious murmurs or rubs are noted.  Skin: Extremities are without significant edema.  Neurologic Exam  Mental status: The patient is alert and oriented x 3 at the time of the examination. The Mini-Mental status examination done today shows a total score of 28/30.  The patient is able to name 17 four-legged animals in 1 minute.  Cranial nerves: Facial symmetry is present. There is good sensation of the face to pinprick and soft touch bilaterally. The strength of the facial muscles and the muscles to head turning and shoulder shrug are normal bilaterally. Speech is well enunciated, no aphasia or dysarthria is noted. Extraocular movements are full. Visual fields are full. The tongue is midline, and the patient has symmetric elevation of the soft palate. No obvious hearing deficits are noted.  Motor: The motor testing reveals 5 over 5 strength of all 4 extremities. Good symmetric motor tone is noted throughout.  Sensory: Sensory testing is intact to pinprick, soft touch, vibration sensation, and position sense on all 4 extremities. No evidence of extinction is noted.  Coordination: Cerebellar testing reveals good finger-nose-finger and heel-to-shin bilaterally.  Gait and station: Gait is normal. Tandem gait is normal. Romberg is negative. No drift is seen.  Reflexes: Deep tendon reflexes are symmetric and normal bilaterally. Toes are downgoing bilaterally.   CT head 11/14/15:  IMPRESSION: 1. Negative for bleed  or other acute intracranial process.  * CT scan images were reviewed online. I agree with the written report.    Assessment/Plan:  1.  Mild memory disturbance, mild cognitive impairment  The patient is already on Aricept, he will continue the 5 mg dose for about a month and then go to the 10 mg dosing.  The patient will be set up for some further blood work today.  He will follow-up in about 6 months.  Jill Alexanders MD 03/16/2018 10:33 AM  [facility] [address] [place], [place] [address]  Phone [phone] Fax [phone]

## 2018-03-17 LAB — SEDIMENTATION RATE: Sed Rate: 2 mm/hr (ref 0–30)

## 2018-03-17 LAB — VITAMIN B12: Vitamin B-12: 632 pg/mL (ref 232–1245)

## 2018-03-17 LAB — RPR: RPR Ser Ql: NONREACTIVE

## 2018-04-21 IMAGING — CT CT HEAD W/O CM
3 series · 16 of 47 positions shown, 19 images · non-contrast
Comparison: None.

CLINICAL DATA: Headache x1 week, hypertension

EXAM:
CT HEAD WITHOUT CONTRAST
TECHNIQUE: Contiguous axial images were obtained from the base of the skull
through the vertex without intravenous contrast.

[Series 2: head wo · axial · 0.45mm/px · z∈[+172,+297]mm · 10 of 31 slices shown, 13 images]
[im 3/31  brain]
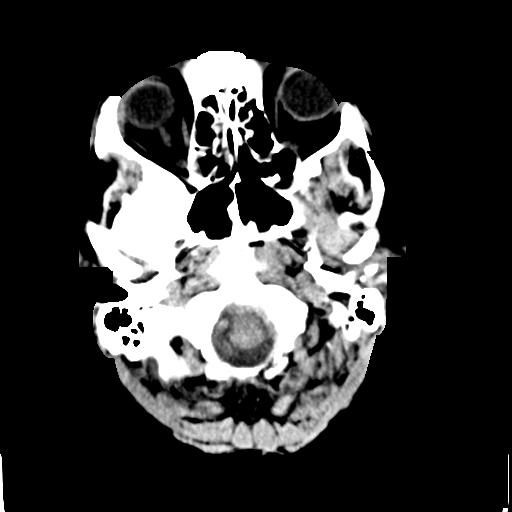
[im 3/31  bone]
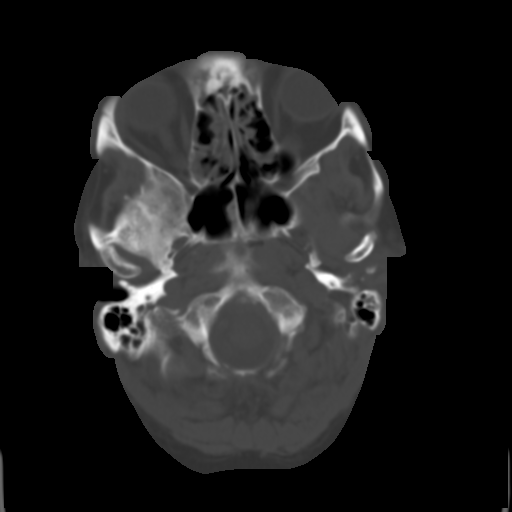
[im 6/31  brain]
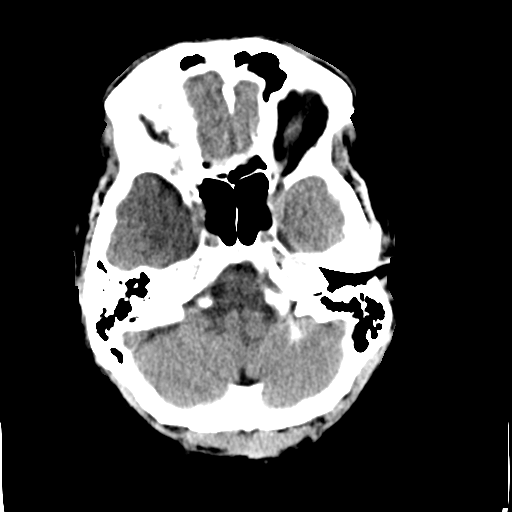
[im 9/31  brain]
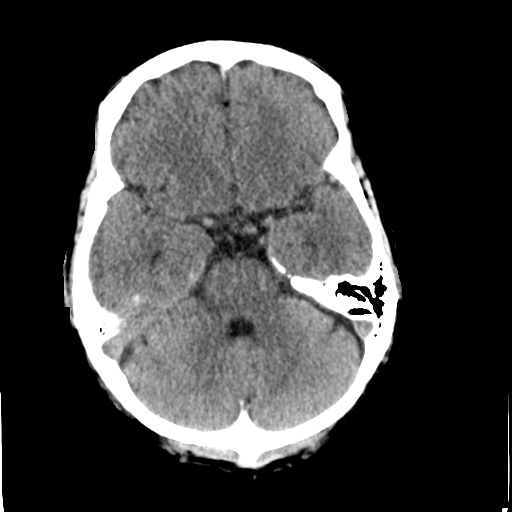
[im 11/31  brain]
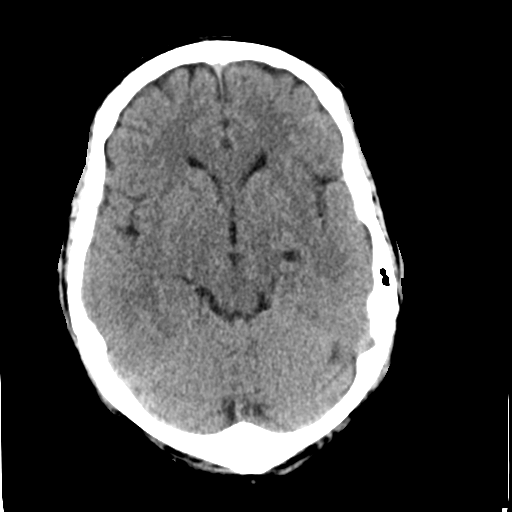
[im 14/31  brain]
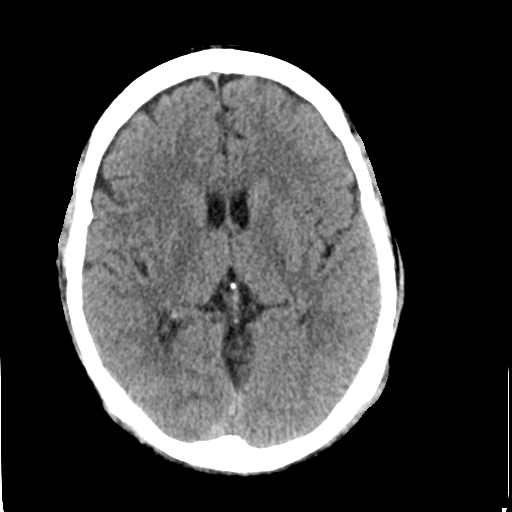
[im 14/31  bone]
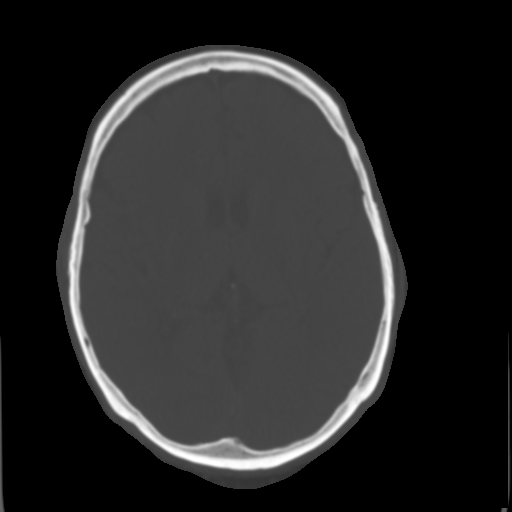
[im 17/31  brain]
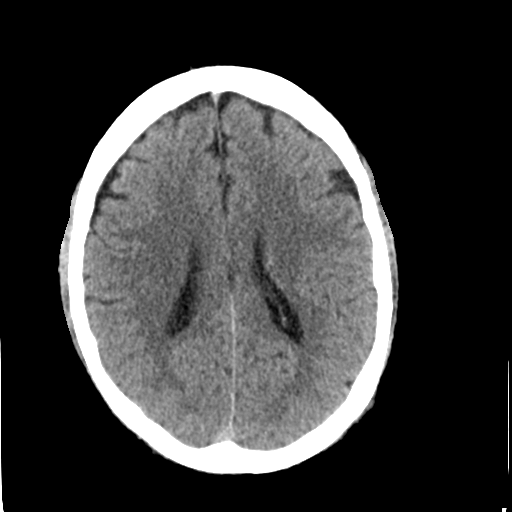
[im 20/31  brain]
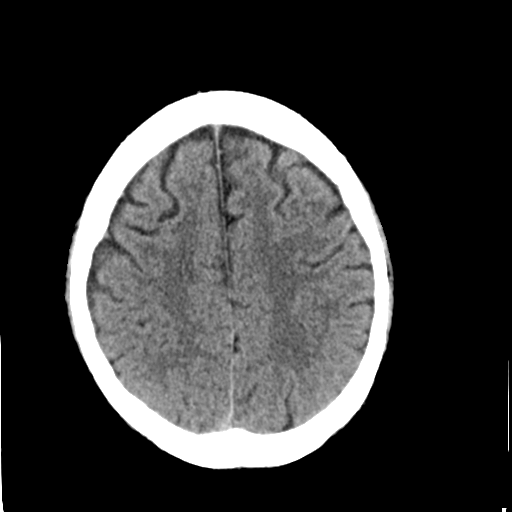
[im 23/31  brain]
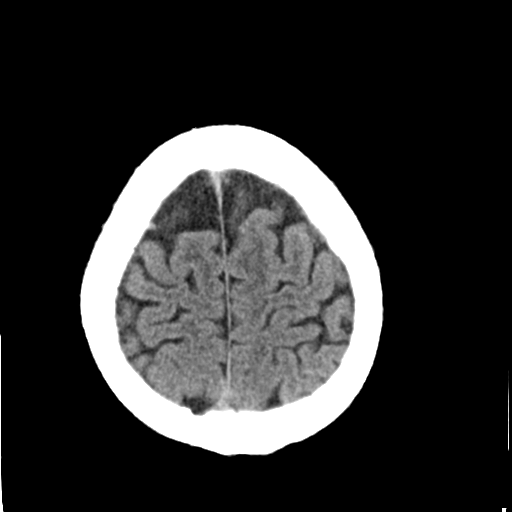
[im 25/31  brain]
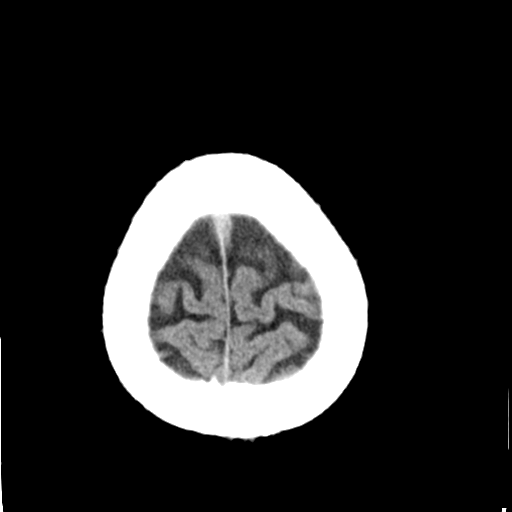
[im 25/31  bone]
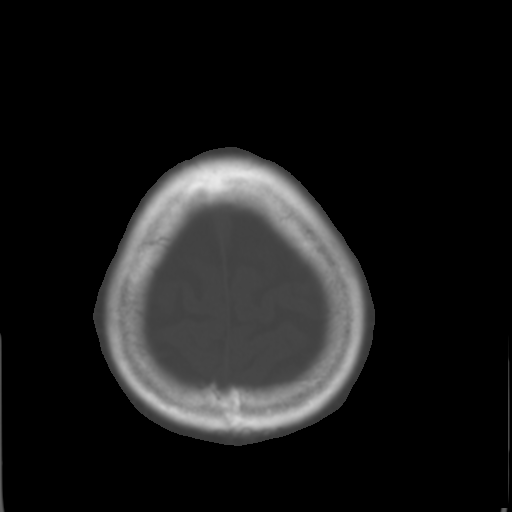
[im 28/31  brain]
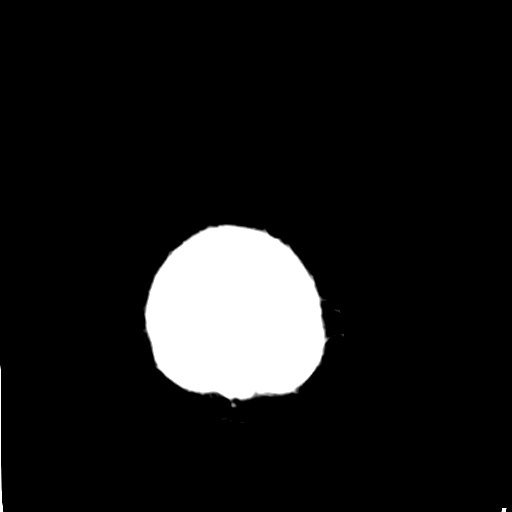

[Series 4: coronal soft tissue · coronal · 0.31mm/px · 3 of 70 slices shown]
[im 24/70  brain]
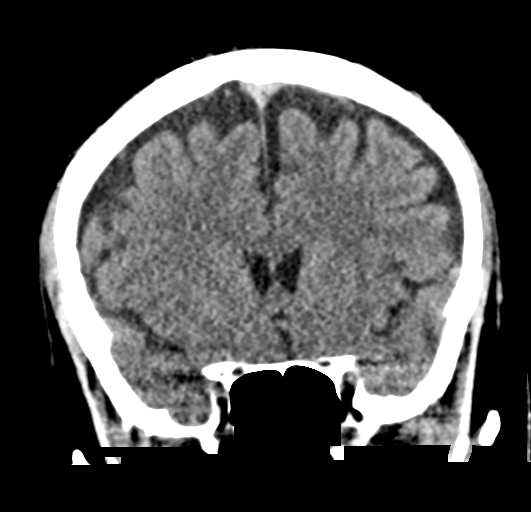
[im 31/70  brain]
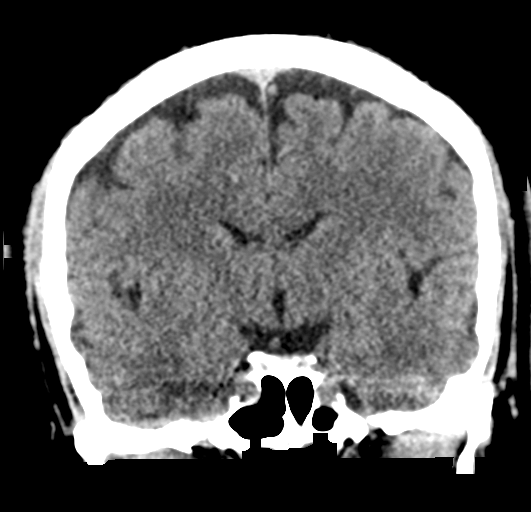
[im 39/70  brain]
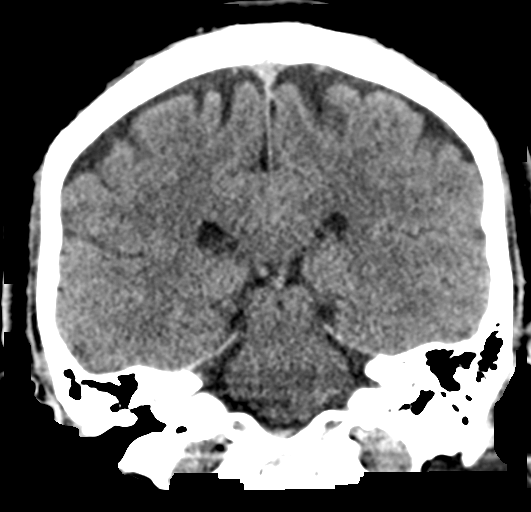

[Series 5: sagittal soft tissue · sagittal · 0.30mm/px · 3 of 60 slices shown]
[im 20/60  brain]
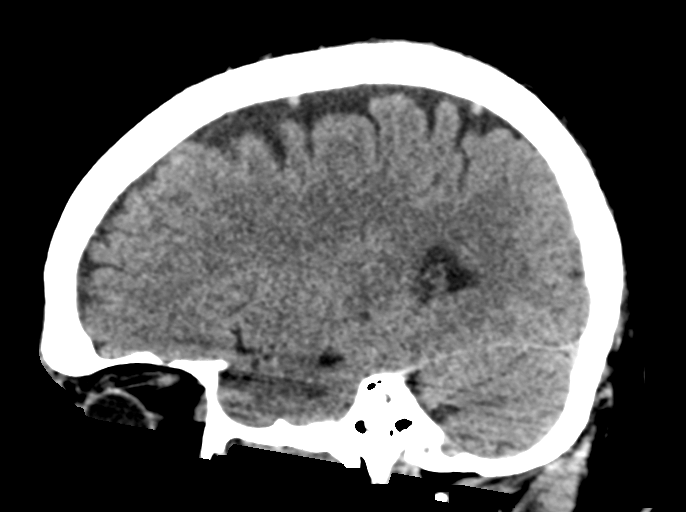
[im 30/60  brain]
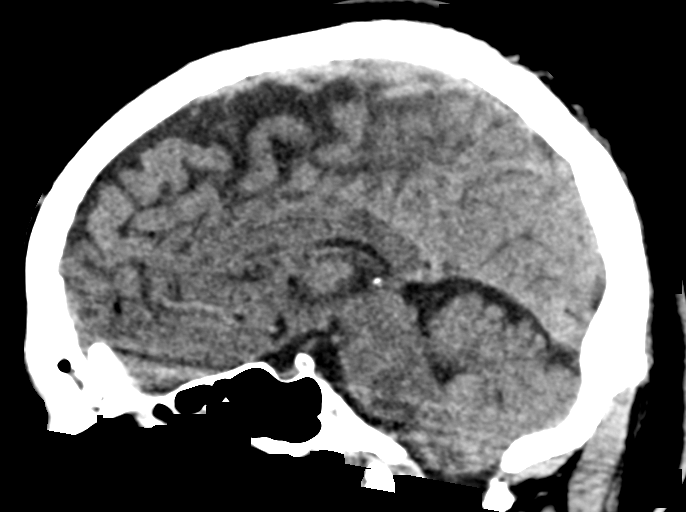
[im 40/60  brain]
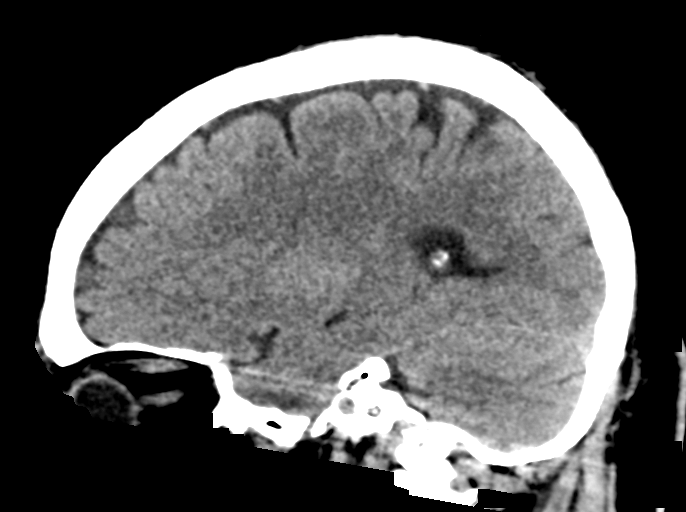

[16 of 47 positions shown; findings below may reference images not displayed]

FINDINGS: Brain: Mild atrophy. No evidence of acute infarction, hemorrhage,
hydrocephalus, extra-axial collection or mass lesion/mass effect.

Vascular: No hyperdense vessel or unexpected calcification.

Skull: Normal. Negative for fracture or focal lesion.

Sinuses/Orbits: Patchy opacification of ethmoid air cells
bilaterally.

Other:    None.
IMPRESSION: 1. Negative for bleed or other acute intracranial process.

## 2018-05-17 ENCOUNTER — Telehealth: Payer: Self-pay | Admitting: Neurology

## 2018-05-17 MED ORDER — DONEPEZIL HCL 10 MG PO TABS: ORAL_TABLET | ORAL | 2 refills | Status: DC

## 2018-05-17 NOTE — Telephone Encounter (Signed)
I reviewed pt's chart and refill is appropriate. Rx has been sent for 10 mg 1 tab daily.

## 2018-05-17 NOTE — Telephone Encounter (Signed)
Pt called in for a refill of donepezil (ARICEPT) 10 MG tablet to be sent to [facility] on file

## 2018-09-07 DIAGNOSIS — Z23 Encounter for immunization: Secondary | ICD-10-CM | POA: Diagnosis not present

## 2018-09-15 NOTE — Progress Notes (Signed)
PATIENT: Stephen Kramer DOB: 15-Jul-1950  REASON FOR VISIT: follow up HISTORY FROM: patient  HISTORY OF PRESENT ILLNESS: Today 09/16/18  Stephen Kramer is a 68 year old male with history of memory disturbance. He feels his memory is stable. He has lost 12 lbs, but he has been trying to with diet and exercise. He lives with his wife. He is very active working in the yard, and doing house upkeep. He is currently insulating his garage. He drives a car well. He has noticed that he will go to sleep well at 10 pm, but wakes up around 2-3 am and can't go back to sleep. He feels his memory has improved and his focus is better. He continue to struggle with short term memory. His health has been good. He manages his finances well.  He presents today for follow-up unaccompanied.  HISTORY 03/16/2018 Dr Jannifer Franklin: Stephen Kramer is a 4 year old right-handed black male with a history of some troubles with memory he claims has been present for about 2 or 3 years.  The patient reports some difficulty with remembering names for people and some word finding problems, he also indicates that he will go into a room in the house and cannot member why he went in there.  He otherwise has no problems keeping up with activities of daily living.  He is able to operate a motor vehicle without difficulty, he is managing his finances well, and he is able to keep up with his medications and appointments.  He claims that both of his parents had memory troubles as they got older.  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  He has not had issues with this since.  He did have a CT scan of the head previously that was unremarkable, no significant small vessel disease was noted.  The patient reports no numbness or weakness of the face, arms, legs.  He denies any problems with balance or difficulty controlling the  bowels or the bladder, he does have some frequency secondary to prostate issues with the bladder.  The patient has had a several year history of occasional sweats and chills at night.  He has recently placed on Aricept taking 10 mg tablets, 1/2 tablet at night.  He is tolerating the medication fairly well so far.  He is sent to this office for an evaluation.   REVIEW OF SYSTEMS: Out of a complete 14 system review of symptoms, the patient complains only of the following symptoms, and all other reviewed systems are negative.  Memory loss  ALLERGIES: Allergies  Allergen Reactions   Feldene [Piroxicam] Diarrhea    HOME MEDICATIONS: Outpatient Medications Prior to Visit  Medication Sig Dispense Refill   amLODipine (NORVASC) 10 MG tablet Take 10 mg by mouth daily.  3   cetirizine (ZYRTEC) 10 MG tablet Take 10 mg daily by mouth.     Coenzyme Q10 (COQ-10) 100 MG CAPS Take 1 capsule daily by mouth.      donepezil (ARICEPT) 10 MG tablet TAKE ONE TABLET BY MOUTH IN THE EVENING 30 tablet 2   Glucos-Chond-Hyal Ac-Ca Fructo (MOVE FREE JOINT HEALTH ADVANCE) TABS Take 1 tablet by mouth daily.     Misc Natural Products (PROSTATE HEALTH PO) Take 1 capsule daily by mouth.     Multiple Vitamins-Minerals (CENTRUM SILVER PO) Take 1 tablet by mouth daily.  No facility-administered medications prior to visit.     PAST MEDICAL HISTORY: Past Medical History:  Diagnosis Date   Anemia    Chest pain    a. 02/2014 MV: EF 67%, no ischemia/infarct.   Chronic prostatitis    GERD (gastroesophageal reflux disease)    not bad   Hypertension    Nocturia     PAST SURGICAL HISTORY: Past Surgical History:  Procedure Laterality Date   ANTERIOR CERVICAL DECOMP/DISCECTOMY FUSION N/A 11/21/2016   Procedure: C4-5, C5-6 ANTERIOR CERVICAL DISCECTOMY AND FUSION, ALLOGRAFT, PLATE;  Surgeon: Marybelle Killings, MD;  Location: [place];  Service: Orthopedics;  Laterality: N/A;   COLONOSCOPY     COLONOSCOPY N/A  09/22/2016   Procedure: COLONOSCOPY;  Surgeon: Danie Binder, MD;  [facility];  Service: Endoscopy;  Laterality: N/A;  1:45pm   HERNIA REPAIR     triple hernia surgery    FAMILY HISTORY: Family History  Problem Relation Age of Onset   Colon cancer Brother    Hypertension Mother    Diabetes Mother    Dementia Mother    Hypertension Father    Dementia Father    Diabetes Sister    Diabetes Brother     SOCIAL HISTORY: Social History   Socioeconomic History   Marital status: Married    Spouse name: Deneise Lever   Number of children: 0   Years of education: Not on file   Highest education level: High school graduate  Occupational History   Occupation: Retired Armed forces operational officer: [employer] resource strain: Not on file   Food insecurity    Worry: Not on file    Inability: Not on file   Transportation needs    Medical: Not on file    Non-medical: Not on file  Tobacco Use   Smoking status: Never Smoker   Smokeless tobacco: Never Used  Substance and Sexual Activity   Alcohol use: Yes    Alcohol/week: 1.0 - 4.0 standard drinks    Types: 1 - 4 Cans of beer per week   Drug use: No   Sexual activity: Yes  Lifestyle   Physical activity    Days per week: Not on file    Minutes per session: Not on file   Stress: Not on file  Relationships   Social connections    Talks on phone: Not on file    Gets together: Not on file    Attends religious service: Not on file    Active member of club or organization: Not on file    Attends meetings of clubs or organizations: Not on file    Relationship status: Not on file   Intimate partner violence    Fear of current or ex partner: Not on file    Emotionally abused: Not on file    Physically abused: Not on file    Forced sexual activity: Not on file  Other Topics Concern   Not on file  Social History Narrative   Lives at home with wife   Right handed   2  cups of coffee daily      PHYSICAL EXAM  Vitals:   09/16/18 1025  BP: 131/82  Pulse: 66  Temp: 97.7 F (36.5 C)  Weight: 177 lb 9.6 oz (80.6 kg)  Height: 5\' 11"  (1.803 m)   Body mass index is 24.77 kg/m.  Generalized: Well developed, in no acute distress  MMSE - Mini Mental State  Exam 09/16/2018 03/16/2018  Orientation to time 5 5  Orientation to Place 5 5  Registration 3 3  Attention/ Calculation 5 5  Recall 1 1  Language- name 2 objects 2 2  Language- repeat 1 1  Language- follow 3 step command 3 3  Language- read & follow direction 1 1  Write a sentence 1 1  Copy design 1 1  Copy design-comments named 27 animals -  Total score 28 28    Neurological examination  Mentation: Alert oriented to time, place, history taking. Follows all commands speech and language fluent Cranial nerve II-XII: Pupils were equal round reactive to light. Extraocular movements were full, visual field were full on confrontational test. Facial sensation and strength were normal. . Head turning and shoulder shrug  were normal and symmetric. Motor: The motor testing reveals 5 over 5 strength of all 4 extremities. Good symmetric motor tone is noted throughout.  Sensory: Sensory testing is intact to soft touch on all 4 extremities. No evidence of extinction is noted.  Coordination: Cerebellar testing reveals good finger-nose-finger and heel-to-shin bilaterally.  Gait and station: Gait is normal.  Reflexes: Deep tendon reflexes are symmetric and normal bilaterally.   DIAGNOSTIC DATA (LABS, IMAGING, TESTING) - I reviewed patient records, labs, notes, testing and imaging myself where available.  Lab Results  Component Value Date   WBC 5.1 11/19/2016   HGB 14.5 11/19/2016   HCT 43.9 11/19/2016   MCV 88.2 11/19/2016   PLT 186 11/19/2016      Component Value Date/Time   NA 136 11/19/2016 1400   K 3.6 11/19/2016 1400   CL 106 11/19/2016 1400   CO2 20 (L) 11/19/2016 1400   GLUCOSE 96 11/19/2016  1400   BUN 9 11/19/2016 1400   CREATININE 0.91 11/19/2016 1400   CALCIUM 9.1 11/19/2016 1400   PROT 7.1 11/19/2016 1400   ALBUMIN 3.9 11/19/2016 1400   AST 27 11/19/2016 1400   ALT 25 11/19/2016 1400   ALKPHOS 71 11/19/2016 1400   BILITOT 2.0 (H) 11/19/2016 1400   GFRNONAA >60 11/19/2016 1400   GFRAA >60 11/19/2016 1400   Lab Results  Component Value Date   CHOL 116 02/11/2014   HDL 39 (L) 02/11/2014   LDLCALC 67 02/11/2014   TRIG 49 02/11/2014   CHOLHDL 3.0 02/11/2014   No results found for: HGBA1C Lab Results  Component Value Date   VITAMINB12 632 03/16/2018   Lab Results  Component Value Date   TSH 1.01 06/25/2010      ASSESSMENT AND PLAN 68 y.o. year old male  has a past medical history of Anemia, Chest pain, Chronic prostatitis, GERD (gastroesophageal reflux disease), Hypertension, and Nocturia. here with:  1.  Mild memory disturbance, mild cognitive impairment  He has noticed a disruption in his sleep pattern since starting Aricept.  He will try taking Aricept 10 mg in the morning to see if he notices any change.  He will call for dose adjustment.  His memory score is stable today, 28/30.  He will follow-up  In 6 months or sooner if needed.  I did advise if his symptoms worsen or if he develops any new symptoms he should let us know.  He has had weight loss as result of diet and exercise of 12 lbs.   I spent 15 minutes with the patient. 50% of this time was spent discussing his plan of care.   Butler Denmark, AGNP-C, DNP 09/16/2018, 10:49 AM [facility] [address], [address]  [place], [place] [address] [phone]

## 2018-09-16 ENCOUNTER — Encounter: Payer: Self-pay | Admitting: Neurology

## 2018-09-16 ENCOUNTER — Ambulatory Visit (INDEPENDENT_AMBULATORY_CARE_PROVIDER_SITE_OTHER): Payer: Medicare Other | Admitting: Neurology

## 2018-09-16 ENCOUNTER — Other Ambulatory Visit: Payer: Self-pay

## 2018-09-16 DIAGNOSIS — R413 Other amnesia: Secondary | ICD-10-CM | POA: Diagnosis not present

## 2018-09-16 MED ORDER — DONEPEZIL HCL 10 MG PO TABS: ORAL_TABLET | ORAL | 1 refills | Status: DC

## 2018-09-16 NOTE — Patient Instructions (Signed)
Try taking Aricept in the morning to see if that helps with your sleep patterns.

## 2018-09-16 NOTE — Progress Notes (Signed)
I have read the note, and I agree with the clinical assessment and plan.  Issiah Huffaker K Sydelle Sherfield   

## 2018-12-07 DIAGNOSIS — R42 Dizziness and giddiness: Secondary | ICD-10-CM | POA: Diagnosis not present

## 2019-03-03 DIAGNOSIS — H2513 Age-related nuclear cataract, bilateral: Secondary | ICD-10-CM | POA: Diagnosis not present

## 2019-03-10 ENCOUNTER — Other Ambulatory Visit: Payer: Self-pay | Admitting: Neurology

## 2019-03-10 DIAGNOSIS — E782 Mixed hyperlipidemia: Secondary | ICD-10-CM | POA: Diagnosis not present

## 2019-03-10 DIAGNOSIS — Z0000 Encounter for general adult medical examination without abnormal findings: Secondary | ICD-10-CM | POA: Diagnosis not present

## 2019-03-10 DIAGNOSIS — E7849 Other hyperlipidemia: Secondary | ICD-10-CM | POA: Diagnosis not present

## 2019-03-10 DIAGNOSIS — Z0001 Encounter for general adult medical examination with abnormal findings: Secondary | ICD-10-CM | POA: Diagnosis not present

## 2019-03-10 DIAGNOSIS — M5030 Other cervical disc degeneration, unspecified cervical region: Secondary | ICD-10-CM | POA: Diagnosis not present

## 2019-03-10 DIAGNOSIS — N411 Chronic prostatitis: Secondary | ICD-10-CM | POA: Diagnosis not present

## 2019-03-10 DIAGNOSIS — Z1389 Encounter for screening for other disorder: Secondary | ICD-10-CM | POA: Diagnosis not present

## 2019-03-10 DIAGNOSIS — I10 Essential (primary) hypertension: Secondary | ICD-10-CM | POA: Diagnosis not present

## 2019-03-22 ENCOUNTER — Ambulatory Visit: Payer: Medicare Other | Admitting: Neurology

## 2019-03-22 ENCOUNTER — Encounter: Payer: Self-pay | Admitting: Neurology

## 2019-03-22 ENCOUNTER — Other Ambulatory Visit: Payer: Self-pay

## 2019-03-22 VITALS — BP 175/90 | Temp 97.4°F | Ht 71.0 in | Wt 179.5 lb

## 2019-03-22 DIAGNOSIS — R413 Other amnesia: Secondary | ICD-10-CM | POA: Diagnosis not present

## 2019-03-22 MED ORDER — DONEPEZIL HCL 10 MG PO TABS: ORAL_TABLET | ORAL | 3 refills | Status: DC

## 2019-03-22 NOTE — Progress Notes (Signed)
I have read the note, and I agree with the clinical assessment and plan.  Keylin Ferryman K Rhona Fusilier   

## 2019-03-22 NOTE — Progress Notes (Signed)
PATIENT: Stephen Kramer DOB: February 19, 1950  REASON FOR VISIT: follow up HISTORY FROM: patient  HISTORY OF PRESENT ILLNESS: Today 03/22/19  Stephen Kramer is a 68 year old male with history of memory disturbance. He takes Aricept 10 mg in the morning.  Switching it to the morning, has helped his sleeping.  He thinks his memory is better since last seen.  He continues to remain quite active, does a lot of projects in the yard.  He drives a car without difficulty.  He has trouble which short-term memory, and remembering people's names.  He has vertigo, takes meclizine.  He is overall doing well, denies any new problems or concerns.  Presents today for follow-up unaccompanied. BP elevated, reports got upset on the drive in from [place].  HISTORY 09/16/2018 SS: Stephen Kramer is a 68 year old male with history of memory disturbance. He feels his memory is stable. He has lost 12 lbs, but he has been trying to with diet and exercise. He lives with his wife. He is very active working in the yard, and doing house upkeep. He is currently insulating his garage. He drives a car well. He has noticed that he will go to sleep well at 10 pm, but wakes up around 2-3 am and can't go back to sleep. He feels his memory has improved and his focus is better. He continue to struggle with short term memory. His health has been good. He manages his finances well.  He presents today for follow-up unaccompanied.   REVIEW OF SYSTEMS: Out of a complete 14 system review of symptoms, the patient complains only of the following symptoms, and all other reviewed systems are negative.  Memory loss  ALLERGIES: Allergies  Allergen Reactions  . Feldene [Piroxicam] Diarrhea    HOME MEDICATIONS: Outpatient Medications Prior to Visit  Medication Sig Dispense Refill  . amLODipine (NORVASC) 10 MG tablet Take 10 mg by mouth daily.  3  . cetirizine (ZYRTEC) 10 MG tablet Take 10 mg daily by mouth.    . Coenzyme Q10 (COQ-10) 100  MG CAPS Take 1 capsule daily by mouth.     . donepezil (ARICEPT) 10 MG tablet TAKE 1 TABLET BY MOUTH EVERY DAY IN THE EVENING 90 tablet 1  . Glucos-Chond-Hyal Ac-Ca Fructo (MOVE FREE JOINT HEALTH ADVANCE) TABS Take 1 tablet by mouth daily.    . meclizine (ANTIVERT) 25 MG tablet Take 25 mg by mouth every 6 (six) hours as needed.    . Misc Natural Products (PROSTATE HEALTH PO) Take 1 capsule daily by mouth.    . Multiple Vitamins-Minerals (CENTRUM SILVER PO) Take 1 tablet by mouth daily.     . tadalafil (CIALIS) 5 MG tablet TAKE 1 3 TABLETS BY MOUTH ONCE DAILY AS NEEDED     No facility-administered medications prior to visit.    PAST MEDICAL HISTORY: Past Medical History:  Diagnosis Date  . Anemia   . Chest pain    a. 02/2014 MV: EF 67%, no ischemia/infarct.  . Chronic prostatitis   . GERD (gastroesophageal reflux disease)    not bad  . Hypertension   . Nocturia     PAST SURGICAL HISTORY: Past Surgical History:  Procedure Laterality Date  . ANTERIOR CERVICAL DECOMP/DISCECTOMY FUSION N/A 11/21/2016   Procedure: C4-5, C5-6 ANTERIOR CERVICAL DISCECTOMY AND FUSION, ALLOGRAFT, PLATE;  Surgeon: Marybelle Killings, MD;  Location: [place];  Service: Orthopedics;  Laterality: N/A;  . COLONOSCOPY    . COLONOSCOPY N/A 09/22/2016   Procedure: COLONOSCOPY;  Surgeon:  Fields, Marga Melnick, MD;  [facility];  Service: Endoscopy;  Laterality: N/A;  1:45pm  . HERNIA REPAIR     triple hernia surgery    FAMILY HISTORY: Family History  Problem Relation Age of Onset  . Colon cancer Brother   . Hypertension Mother   . Diabetes Mother   . Dementia Mother   . Hypertension Father   . Dementia Father   . Diabetes Sister   . Diabetes Brother     SOCIAL HISTORY: Social History   Socioeconomic History  . Marital status: Married    Spouse name: Deneise Lever  . Number of children: 0  . Years of education: Not on file  . Highest education level: High school graduate  Occupational History  .  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 Smoking status: Never Smoker  . Smokeless tobacco: Never Used  Substance and Sexual Activity  . Alcohol use: Yes    Alcohol/week: 1.0 - 4.0 standard drinks    Types: 1 - 4 Cans of beer per week  . Drug use: No  . Sexual activity: Yes  Other Topics Concern  . Not on file  Social History Narrative   Lives at home with wife   Right handed   2 cups of coffee daily   Social Determinants of Health   Financial Resource Strain:   . Difficulty of Paying Living Expenses:   Food Insecurity:   . Worried About Charity fundraiser in the Last Year:   . Arboriculturist in the Last Year:   Transportation Needs:   . Film/video editor (Medical):   Marland Kitchen Lack of Transportation (Non-Medical):   Physical Activity:   . Days of Exercise per Week:   . Minutes of Exercise per Session:   Stress:   . Feeling of Stress :   Social Connections:   . Frequency of Communication with Friends and Family:   . Frequency of Social Gatherings with Friends and Family:   . Attends Religious Services:   . Active Member of Clubs or Organizations:   . Attends Archivist Meetings:   Marland Kitchen Marital Status:   Intimate Partner Violence:   . Fear of Current or Ex-Partner:   . Emotionally Abused:   Marland Kitchen Physically Abused:   . Sexually Abused:    PHYSICAL EXAM  Vitals:   03/22/19 1448  BP: (!) 175/90  Temp: (!) 97.4 F (36.3 C)  Weight: 179 lb 8 oz (81.4 kg)  Height: 5\' 11"  (1.803 m)   Body mass index is 25.04 kg/m.  Generalized: Well developed, in no acute distress  MMSE - Mini Mental State Exam 03/22/2019 09/16/2018 03/16/2018  Orientation to time 5 5 5   Orientation to Place 5 5 5   Registration 3 3 3   Attention/ Calculation 5 5 5   Recall 3 1 1   Language- name 2 objects 2 2 2   Language- repeat 1 1 1   Language- follow 3 step command 3 3 3   Language- read & follow direction 1 1 1   Write a sentence 1 1 1   Copy design 1 1  1   Copy design-comments - named 27 animals -  Total score 30 28 28     Neurological examination  Mentation: Alert oriented to time, place, history taking. Follows all commands speech and language fluent Cranial nerve II-XII: Pupils were equal round reactive to light. Extraocular movements were full, visual field were full on confrontational test. Facial  sensation and strength were normal. Head turning and shoulder shrug  were normal and symmetric. Motor: The motor testing reveals 5 over 5 strength of all 4 extremities. Good symmetric motor tone is noted throughout.  Sensory: Sensory testing is intact to soft touch on all 4 extremities. No evidence of extinction is noted.  Coordination: Cerebellar testing reveals good finger-nose-finger and heel-to-shin bilaterally.  Gait and station: Gait is normal.  Reflexes: Deep tendon reflexes are symmetric and normal bilaterally.   DIAGNOSTIC DATA (LABS, IMAGING, TESTING) - I reviewed patient records, labs, notes, testing and imaging myself where available.  Lab Results  Component Value Date   WBC 5.1 11/19/2016   HGB 14.5 11/19/2016   HCT 43.9 11/19/2016   MCV 88.2 11/19/2016   PLT 186 11/19/2016      Component Value Date/Time   NA 136 11/19/2016 1400   K 3.6 11/19/2016 1400   CL 106 11/19/2016 1400   CO2 20 (L) 11/19/2016 1400   GLUCOSE 96 11/19/2016 1400   BUN 9 11/19/2016 1400   CREATININE 0.91 11/19/2016 1400   CALCIUM 9.1 11/19/2016 1400   PROT 7.1 11/19/2016 1400   ALBUMIN 3.9 11/19/2016 1400   AST 27 11/19/2016 1400   ALT 25 11/19/2016 1400   ALKPHOS 71 11/19/2016 1400   BILITOT 2.0 (H) 11/19/2016 1400   GFRNONAA >60 11/19/2016 1400   GFRAA >60 11/19/2016 1400   Lab Results  Component Value Date   CHOL 116 02/11/2014   HDL 39 (L) 02/11/2014   LDLCALC 67 02/11/2014   TRIG 49 02/11/2014   CHOLHDL 3.0 02/11/2014   No results found for: HGBA1C Lab Results  Component Value Date   VITAMINB12 632 03/16/2018   Lab Results    Component Value Date   TSH 1.01 06/25/2010      ASSESSMENT AND PLAN 69 y.o. year old male  has a past medical history of Anemia, Chest pain, Chronic prostatitis, GERD (gastroesophageal reflux disease), Hypertension, and Nocturia. here with:  1. Mild memory disturbance   He has done well since last seen, memory score is 30 out of 30.  He will remain on Aricept 10 mg in the morning.  He will follow-up here in 1 year or sooner if needed.   I spent 15 minutes with the patient. 50% of this time was spent discussing his plan of care.   Butler Denmark, AGNP-C, DNP 03/22/2019, 2:50 PM [facility] [address] [place], [place] [address] [phone]

## 2019-03-22 NOTE — Patient Instructions (Signed)
It was nice to see you today :) Continue the Aricept, refills were sent  Memory score was excellent today 30/30 Continue to see your primary doctor See you in 1 year

## 2020-03-12 DIAGNOSIS — E7849 Other hyperlipidemia: Secondary | ICD-10-CM | POA: Diagnosis not present

## 2020-03-12 DIAGNOSIS — Z0001 Encounter for general adult medical examination with abnormal findings: Secondary | ICD-10-CM | POA: Diagnosis not present

## 2020-03-12 DIAGNOSIS — M4322 Fusion of spine, cervical region: Secondary | ICD-10-CM | POA: Diagnosis not present

## 2020-03-12 DIAGNOSIS — I10 Essential (primary) hypertension: Secondary | ICD-10-CM | POA: Diagnosis not present

## 2020-03-12 DIAGNOSIS — Z1389 Encounter for screening for other disorder: Secondary | ICD-10-CM | POA: Diagnosis not present

## 2020-06-13 ENCOUNTER — Other Ambulatory Visit: Payer: Self-pay | Admitting: Neurology

## 2020-06-18 ENCOUNTER — Telehealth: Payer: Self-pay | Admitting: *Deleted

## 2020-06-18 MED ORDER — DONEPEZIL HCL 10 MG PO TABS: ORAL_TABLET | ORAL | 0 refills | Status: DC

## 2020-06-18 NOTE — Telephone Encounter (Signed)
Pt called request refill for donepezil 10mg . [facility] [place].Pt # [phone]

## 2020-06-20 ENCOUNTER — Other Ambulatory Visit: Payer: Self-pay

## 2020-06-20 ENCOUNTER — Ambulatory Visit: Payer: Medicare Other | Admitting: Neurology

## 2020-06-20 ENCOUNTER — Encounter: Payer: Self-pay | Admitting: Neurology

## 2020-06-20 VITALS — BP 156/78 | HR 53 | Ht 71.0 in | Wt 181.0 lb

## 2020-06-20 DIAGNOSIS — R351 Nocturia: Secondary | ICD-10-CM | POA: Diagnosis not present

## 2020-06-20 MED ORDER — DONEPEZIL HCL 10 MG PO TABS: ORAL_TABLET | ORAL | 4 refills | Status: DC

## 2020-06-20 NOTE — Progress Notes (Signed)
PATIENT: Stephen Kramer DOB: Nov 08, 1950  REASON FOR VISIT: follow up HISTORY FROM: patient  HISTORY OF PRESENT ILLNESS: Today 06/20/20  Stephen Kramer is a 70 year old male with history of memory disturbance. MMSE 30/30 today. Remains on Aricept 10 mg AM. Forgets some things, such as his mask before leaving the house. Lives with his wife, does his own ADLs, pays the bills, does medications. No health issues. Feels like Aricept is helping. Vertigo is doing well, takes 1-2 pills as needed. Went to [facility] for 1 week recently, his wife is a Immunologist. Both his parents had Dementia. Doing well today.  03/22/2019 SS: Stephen Kramer is a 70 year old male with history of memory disturbance. He takes Aricept 10 mg in the morning.  Switching it to the morning, has helped his sleeping.  He thinks his memory is better since last seen.  He continues to remain quite active, does a lot of projects in the yard.  He drives a car without difficulty.  He has trouble which short-term memory, and remembering people's names.  He has vertigo, takes meclizine.  He is overall doing well, denies any new problems or concerns.  Presents today for follow-up unaccompanied. BP elevated, reports got upset on the drive in from [place].  HISTORY 09/16/2018 SS: Stephen Kramer is a 70 year old male with history of memory disturbance. He feels his memory is stable. He has lost 12 lbs, but he has been trying to with diet and exercise. He lives with his wife. He is very active working in the yard, and doing house upkeep. He is currently insulating his garage. He drives a car well. He has noticed that he will go to sleep well at 10 pm, but wakes up around 2-3 am and can't go back to sleep. He feels his memory has improved and his focus is better. He continue to struggle with short term memory. His health has been good. He manages his finances well.  He presents today for follow-up unaccompanied.   REVIEW OF SYSTEMS: Out of a complete  14 system review of symptoms, the patient complains only of the following symptoms, and all other reviewed systems are negative.  Memory loss  ALLERGIES: Allergies  Allergen Reactions   Feldene [Piroxicam] Diarrhea    HOME MEDICATIONS: Outpatient Medications Prior to Visit  Medication Sig Dispense Refill   amLODipine (NORVASC) 10 MG tablet Take 10 mg by mouth daily.  3   cetirizine (ZYRTEC) 10 MG tablet Take 10 mg daily by mouth.     Coenzyme Q10 (COQ-10) 100 MG CAPS Take 1 capsule daily by mouth.      donepezil (ARICEPT) 10 MG tablet TAKE 1 TABLET BY MOUTH daily 90 tablet 0   Glucos-Chond-Hyal Ac-Ca Fructo (MOVE FREE JOINT HEALTH ADVANCE) TABS Take 1 tablet by mouth daily.     meclizine (ANTIVERT) 25 MG tablet Take 25 mg by mouth every 6 (six) hours as needed.     Misc Natural Products (PROSTATE HEALTH PO) Take 1 capsule daily by mouth.     Multiple Vitamins-Minerals (CENTRUM SILVER PO) Take 1 tablet by mouth daily.      tadalafil (CIALIS) 5 MG tablet TAKE 1 3 TABLETS BY MOUTH ONCE DAILY AS NEEDED     No facility-administered medications prior to visit.    PAST MEDICAL HISTORY: Past Medical History:  Diagnosis Date   Anemia    Chest pain    a. 02/2014 MV: EF 67%, no ischemia/infarct.   Chronic prostatitis    GERD (  gastroesophageal reflux disease)    not bad   Hypertension    Nocturia     PAST SURGICAL HISTORY: Past Surgical History:  Procedure Laterality Date   ANTERIOR CERVICAL DECOMP/DISCECTOMY FUSION N/A 11/21/2016   Procedure: C4-5, C5-6 ANTERIOR CERVICAL DISCECTOMY AND FUSION, ALLOGRAFT, PLATE;  Surgeon: Marybelle Killings, MD;  Location: [facility];  Service: Orthopedics;  Laterality: N/A;   COLONOSCOPY     COLONOSCOPY N/A 09/22/2016   Procedure: COLONOSCOPY;  Surgeon: Danie Binder, MD;  [facility];  Service: Endoscopy;  Laterality: N/A;  1:45pm   HERNIA REPAIR     triple hernia surgery    FAMILY HISTORY: Family History  Problem Relation Age of Onset    Colon cancer Brother    Hypertension Mother    Diabetes Mother    Dementia Mother    Hypertension Father    Dementia Father    Diabetes Sister    Diabetes Brother     SOCIAL HISTORY: Social History   Socioeconomic History   Marital status: Married    Spouse name: Deneise Lever   Number of children: 0   Years of education: Not on file   Highest education level: High school graduate  Occupational History   Occupation: Retired Armed forces operational officer: [employer]  Tobacco Use   Smoking status: Never   Smokeless tobacco: Never  Vaping Use   Vaping Use: Never used  Substance and Sexual Activity   Alcohol use: Yes    Alcohol/week: 1.0 - 4.0 standard drink    Types: 1 - 4 Cans of beer per week   Drug use: No   Sexual activity: Yes  Other Topics Concern   Not on file  Social History Narrative   Lives at home with wife   Right handed   2 cups of coffee daily   Social Determinants of Health   Financial Resource Strain: Not on file  Food Insecurity: Not on file  Transportation Needs: Not on file  Physical Activity: Not on file  Stress: Not on file  Social Connections: Not on file  Intimate Partner Violence: Not on file   PHYSICAL EXAM  Vitals:   06/20/20 1358  BP: (!) 156/78  Pulse: (!) 53  Weight: 181 lb (82.1 kg)  Height: 5\' 11"  (1.803 m)    Body mass index is 25.24 kg/m.  Generalized: Well developed, in no acute distress  MMSE - Mini Mental State Exam 06/20/2020 03/22/2019 09/16/2018  Orientation to time 5 5 5   Orientation to Place 5 5 5   Registration 3 3 3   Attention/ Calculation 5 5 5   Recall 3 3 1   Language- name 2 objects 2 2 2   Language- repeat 1 1 1   Language- follow 3 step command 3 3 3   Language- read & follow direction 1 1 1   Write a sentence 1 1 1   Copy design 1 1 1   Copy design-comments - - named 27 animals  Total score 30 30 28     Neurological examination  Mentation: Alert oriented to time, place, history taking. Follows all commands  speech and language fluent Cranial nerve II-XII: Pupils were equal round reactive to light. Extraocular movements were full, visual field were full on confrontational test. Facial sensation and strength were normal. Head turning and shoulder shrug  were normal and symmetric. Motor: The motor testing reveals 5 over 5 strength of all 4 extremities. Good symmetric motor tone is noted throughout.  Sensory: Sensory testing is intact to soft  touch on all 4 extremities. No evidence of extinction is noted.  Coordination: Cerebellar testing reveals good finger-nose-finger and heel-to-shin bilaterally.  Gait and station: Gait is normal.  Reflexes: Deep tendon reflexes are symmetric and normal bilaterally.   DIAGNOSTIC DATA (LABS, IMAGING, TESTING) - I reviewed patient records, labs, notes, testing and imaging myself where available.  Lab Results  Component Value Date   WBC 5.1 11/19/2016   HGB 14.5 11/19/2016   HCT 43.9 11/19/2016   MCV 88.2 11/19/2016   PLT 186 11/19/2016      Component Value Date/Time   NA 136 11/19/2016 1400   K 3.6 11/19/2016 1400   CL 106 11/19/2016 1400   CO2 20 (L) 11/19/2016 1400   GLUCOSE 96 11/19/2016 1400   BUN 9 11/19/2016 1400   CREATININE 0.91 11/19/2016 1400   CALCIUM 9.1 11/19/2016 1400   PROT 7.1 11/19/2016 1400   ALBUMIN 3.9 11/19/2016 1400   AST 27 11/19/2016 1400   ALT 25 11/19/2016 1400   ALKPHOS 71 11/19/2016 1400   BILITOT 2.0 (H) 11/19/2016 1400   GFRNONAA >60 11/19/2016 1400   GFRAA >60 11/19/2016 1400   Lab Results  Component Value Date   CHOL 116 02/11/2014   HDL 39 (L) 02/11/2014   LDLCALC 67 02/11/2014   TRIG 49 02/11/2014   CHOLHDL 3.0 02/11/2014   No results found for: HGBA1C Lab Results  Component Value Date   VITAMINB12 632 03/16/2018   Lab Results  Component Value Date   TSH 1.01 06/25/2010   ASSESSMENT AND PLAN 70 y.o. year old male  has a past medical history of Anemia, Chest pain, Chronic prostatitis, GERD  (gastroesophageal reflux disease), Hypertension, and Nocturia. here with:  1. Mild memory disturbance   -Remains overall stable, MMSE 30/30 today -Continue Aricept 10 mg in AM -Remains quite independent, highly functional -Follow-up in 1 year or sooner if needed  Butler Denmark, Laqueta Jean, DNP 06/20/2020, 2:12 PM [facility] [address] [place], [place] [address] [phone]

## 2020-06-20 NOTE — Progress Notes (Signed)
I have read the note, and I agree with the clinical assessment and plan.  Orlean Holtrop K Vivan Vanderveer   

## 2020-06-20 NOTE — Patient Instructions (Signed)
Continue Aricept for memory  Will follow overtime  See you back in 1 year

## 2020-07-18 DIAGNOSIS — J069 Acute upper respiratory infection, unspecified: Secondary | ICD-10-CM | POA: Diagnosis not present

## 2020-08-23 DIAGNOSIS — I10 Essential (primary) hypertension: Secondary | ICD-10-CM | POA: Diagnosis not present

## 2020-08-23 DIAGNOSIS — J302 Other seasonal allergic rhinitis: Secondary | ICD-10-CM | POA: Diagnosis not present

## 2020-09-06 DIAGNOSIS — H2513 Age-related nuclear cataract, bilateral: Secondary | ICD-10-CM | POA: Diagnosis not present

## 2020-09-19 DIAGNOSIS — G4730 Sleep apnea, unspecified: Secondary | ICD-10-CM | POA: Diagnosis not present

## 2020-10-02 DIAGNOSIS — Z23 Encounter for immunization: Secondary | ICD-10-CM | POA: Diagnosis not present

## 2020-10-05 DIAGNOSIS — H2513 Age-related nuclear cataract, bilateral: Secondary | ICD-10-CM | POA: Diagnosis not present

## 2020-10-05 DIAGNOSIS — H20012 Primary iridocyclitis, left eye: Secondary | ICD-10-CM | POA: Diagnosis not present

## 2020-10-17 DIAGNOSIS — G4733 Obstructive sleep apnea (adult) (pediatric): Secondary | ICD-10-CM | POA: Diagnosis not present

## 2020-10-31 DIAGNOSIS — J302 Other seasonal allergic rhinitis: Secondary | ICD-10-CM | POA: Diagnosis not present

## 2020-10-31 DIAGNOSIS — G4733 Obstructive sleep apnea (adult) (pediatric): Secondary | ICD-10-CM | POA: Diagnosis not present

## 2020-10-31 DIAGNOSIS — J309 Allergic rhinitis, unspecified: Secondary | ICD-10-CM | POA: Diagnosis not present

## 2021-02-04 DIAGNOSIS — K645 Perianal venous thrombosis: Secondary | ICD-10-CM | POA: Diagnosis not present

## 2021-02-05 DIAGNOSIS — K645 Perianal venous thrombosis: Secondary | ICD-10-CM | POA: Diagnosis not present

## 2021-03-13 DIAGNOSIS — I10 Essential (primary) hypertension: Secondary | ICD-10-CM | POA: Diagnosis not present

## 2021-03-13 DIAGNOSIS — G4733 Obstructive sleep apnea (adult) (pediatric): Secondary | ICD-10-CM | POA: Diagnosis not present

## 2021-03-13 DIAGNOSIS — Z0001 Encounter for general adult medical examination with abnormal findings: Secondary | ICD-10-CM | POA: Diagnosis not present

## 2021-03-13 DIAGNOSIS — E7849 Other hyperlipidemia: Secondary | ICD-10-CM | POA: Diagnosis not present

## 2021-03-13 DIAGNOSIS — E782 Mixed hyperlipidemia: Secondary | ICD-10-CM | POA: Diagnosis not present

## 2021-03-13 DIAGNOSIS — J309 Allergic rhinitis, unspecified: Secondary | ICD-10-CM | POA: Diagnosis not present

## 2021-03-13 DIAGNOSIS — J302 Other seasonal allergic rhinitis: Secondary | ICD-10-CM | POA: Diagnosis not present

## 2021-03-13 DIAGNOSIS — M4322 Fusion of spine, cervical region: Secondary | ICD-10-CM | POA: Diagnosis not present

## 2021-03-27 DIAGNOSIS — K645 Perianal venous thrombosis: Secondary | ICD-10-CM | POA: Diagnosis not present

## 2021-06-20 ENCOUNTER — Encounter: Payer: Self-pay | Admitting: Neurology

## 2021-06-20 ENCOUNTER — Ambulatory Visit: Payer: Medicare Other | Admitting: Neurology

## 2021-06-20 VITALS — BP 163/83 | HR 55 | Ht 71.0 in | Wt 183.0 lb

## 2021-06-20 DIAGNOSIS — R413 Other amnesia: Secondary | ICD-10-CM

## 2021-06-20 MED ORDER — DONEPEZIL HCL 10 MG PO TABS: ORAL_TABLET | ORAL | 4 refills | Status: DC

## 2021-06-20 NOTE — Progress Notes (Signed)
PATIENT: Stephen Kramer DOB: 10/28/1950  REASON FOR VISIT: follow up for mild memory disturbance HISTORY FROM: patient PRIMARY NEUROLOGIST: Dr. Julius Bowels. Rexene Alberts  HISTORY OF PRESENT ILLNESS: Today 06/20/21  Stephen Kramer is here today for follow-up. Doing well. MMSE 30/30. Memory is doing well. His wife is a Immunologist she is on an out of town work Advice worker, he lives by himself. He does well. He drives without problem. Health is good. Tolerating Aricept well, thinks it is helping. Sleeps well. Needs to get back into the gym. He is independent. His wife likes him to come annually for evaluation.   06/20/20 SS: Stephen Kramer is a 71 year old male with history of memory disturbance. MMSE 30/30 today. Remains on Aricept 10 mg AM. Forgets some things, such as his mask before leaving the house. Lives with his wife, does his own ADLs, pays the bills, does medications. No health issues. Feels like Aricept is helping. Vertigo is doing well, takes 1-2 pills as needed. Went to [facility] for 1 week recently, his wife is a Immunologist. Both his parents had Dementia. Doing well today.  03/22/2019 SS: Stephen Kramer is a 71 year old male with history of memory disturbance. He takes Aricept 10 mg in the morning.  Switching it to the morning, has helped his sleeping.  He thinks his memory is better since last seen.  He continues to remain quite active, does a lot of projects in the yard.  He drives a car without difficulty.  He has trouble which short-term memory, and remembering people's names.  He has vertigo, takes meclizine.  He is overall doing well, denies any new problems or concerns.  Presents today for follow-up unaccompanied. BP elevated, reports got upset on the drive in from [place].  HISTORY 09/16/2018 SS: Stephen Kramer is a 71 year old male with history of memory disturbance. He feels his memory is stable. He has lost 12 lbs, but he has been trying to with diet and exercise. He lives with his wife. He  is very active working in the yard, and doing house upkeep. He is currently insulating his garage. He drives a car well. He has noticed that he will go to sleep well at 10 pm, but wakes up around 2-3 am and can't go back to sleep. He feels his memory has improved and his focus is better. He continue to struggle with short term memory. His health has been good. He manages his finances well.  He presents today for follow-up unaccompanied.   REVIEW OF SYSTEMS: Out of a complete 14 system review of symptoms, the patient complains only of the following symptoms, and all other reviewed systems are negative.  See HPI  ALLERGIES: Allergies  Allergen Reactions   Feldene [Piroxicam] Diarrhea    HOME MEDICATIONS: Outpatient Medications Prior to Visit  Medication Sig Dispense Refill   amLODipine (NORVASC) 10 MG tablet Take 10 mg by mouth daily.  3   cetirizine (ZYRTEC) 10 MG tablet Take 10 mg daily by mouth.     Coenzyme Q10 (COQ-10) 100 MG CAPS Take 1 capsule daily by mouth.      Glucos-Chond-Hyal Ac-Ca Fructo (MOVE FREE JOINT HEALTH ADVANCE) TABS Take 1 tablet by mouth daily.     meclizine (ANTIVERT) 25 MG tablet Take 25 mg by mouth every 6 (six) hours as needed.     Misc Natural Products (PROSTATE HEALTH PO) Take 1 capsule daily by mouth.     Multiple Vitamins-Minerals (CENTRUM SILVER PO) Take 1 tablet by mouth  daily.      olmesartan (BENICAR) 40 MG tablet Take 20-40 mg by mouth daily.     tadalafil (CIALIS) 5 MG tablet TAKE 1 3 TABLETS BY MOUTH ONCE DAILY AS NEEDED     donepezil (ARICEPT) 10 MG tablet TAKE 1 TABLET BY MOUTH daily 90 tablet 4   No facility-administered medications prior to visit.    PAST MEDICAL HISTORY: Past Medical History:  Diagnosis Date   Anemia    Chest pain    a. 02/2014 MV: EF 67%, no ischemia/infarct.   Chronic prostatitis    GERD (gastroesophageal reflux disease)    not bad   Hypertension    Nocturia     PAST SURGICAL HISTORY: Past Surgical History:   Procedure Laterality Date   ANTERIOR CERVICAL DECOMP/DISCECTOMY FUSION N/A 11/21/2016   Procedure: C4-5, C5-6 ANTERIOR CERVICAL DISCECTOMY AND FUSION, ALLOGRAFT, PLATE;  Surgeon: Marybelle Killings, MD;  Location: [facility];  Service: Orthopedics;  Laterality: N/A;   COLONOSCOPY     COLONOSCOPY N/A 09/22/2016   Procedure: COLONOSCOPY;  Surgeon: Danie Binder, MD;  [facility];  Service: Endoscopy;  Laterality: N/A;  1:45pm   HERNIA REPAIR     triple hernia surgery    FAMILY HISTORY: Family History  Problem Relation Age of Onset   Colon cancer Brother    Hypertension Mother    Diabetes Mother    Dementia Mother    Hypertension Father    Dementia Father    Diabetes Sister    Diabetes Brother     SOCIAL HISTORY: Social History   Socioeconomic History   Marital status: Married    Spouse name: Deneise Lever   Number of children: 0   Years of education: Not on file   Highest education level: High school graduate  Occupational History   Occupation: Retired Armed forces operational officer: [employer]  Tobacco Use   Smoking status: Never   Smokeless tobacco: Never  Vaping Use   Vaping Use: Never used  Substance and Sexual Activity   Alcohol use: Yes    Alcohol/week: 1.0 - 4.0 standard drink of alcohol    Types: 1 - 4 Cans of beer per week   Drug use: No   Sexual activity: Yes  Other Topics Concern   Not on file  Social History Narrative   Lives at home with wife   Right handed   2 cups of coffee daily   Social Determinants of Health   Financial Resource Strain: Not on file  Food Insecurity: Not on file  Transportation Needs: Not on file  Physical Activity: Not on file  Stress: Not on file  Social Connections: Not on file  Intimate Partner Violence: Not on file   PHYSICAL EXAM  Vitals:   06/20/21 1449  BP: (!) 163/83  Pulse: (!) 55  Weight: 183 lb (83 kg)  Height: '5\' 11"'$  (1.803 m)   Body mass index is 25.52 kg/m.  Generalized: Well developed, in no acute  distress     06/20/2021    2:55 PM 06/20/2020    2:00 PM 03/22/2019    2:49 PM  MMSE - Mini Mental State Exam  Orientation to time '5 5 5  '$ Orientation to Place '5 5 5  '$ Registration '3 3 3  '$ Attention/ Calculation '5 5 5  '$ Recall '3 3 3  '$ Language- name 2 objects '2 2 2  '$ Language- repeat  1 1  Language- follow 3 step command  3 3  Language-  read & follow direction  1 1  Write a sentence  1 1  Copy design  1 1  Total score  30 30    Neurological examination  Mentation: Alert oriented to time, place, history taking. Follows all commands speech and language fluent Cranial nerve II-XII: Pupils were equal round reactive to light. Extraocular movements were full, visual field were full on confrontational test. Facial sensation and strength were normal. Head turning and shoulder shrug  were normal and symmetric. Motor: The motor testing reveals 5 over 5 strength of all 4 extremities. Good symmetric motor tone is noted throughout.  Sensory: Sensory testing is intact to soft touch on all 4 extremities. No evidence of extinction is noted.  Coordination: Cerebellar testing reveals good finger-nose-finger and heel-to-shin bilaterally.  Gait and station: Gait is normal. Independent Reflexes: Deep tendon reflexes are symmetric and normal bilaterally.   DIAGNOSTIC DATA (LABS, IMAGING, TESTING) - I reviewed patient records, labs, notes, testing and imaging myself where available.  Lab Results  Component Value Date   WBC 5.1 11/19/2016   HGB 14.5 11/19/2016   HCT 43.9 11/19/2016   MCV 88.2 11/19/2016   PLT 186 11/19/2016      Component Value Date/Time   NA 136 11/19/2016 1400   K 3.6 11/19/2016 1400   CL 106 11/19/2016 1400   CO2 20 (L) 11/19/2016 1400   GLUCOSE 96 11/19/2016 1400   BUN 9 11/19/2016 1400   CREATININE 0.91 11/19/2016 1400   CALCIUM 9.1 11/19/2016 1400   PROT 7.1 11/19/2016 1400   ALBUMIN 3.9 11/19/2016 1400   AST 27 11/19/2016 1400   ALT 25 11/19/2016 1400   ALKPHOS 71  11/19/2016 1400   BILITOT 2.0 (H) 11/19/2016 1400   GFRNONAA >60 11/19/2016 1400   GFRAA >60 11/19/2016 1400   Lab Results  Component Value Date   CHOL 116 02/11/2014   HDL 39 (L) 02/11/2014   LDLCALC 67 02/11/2014   TRIG 49 02/11/2014   CHOLHDL 3.0 02/11/2014   No results found for: "HGBA1C" Lab Results  Component Value Date   VITAMINB12 632 03/16/2018   Lab Results  Component Value Date   TSH 1.01 06/25/2010   ASSESSMENT AND PLAN 71 y.o. year old male  has a past medical history of Anemia, Chest pain, Chronic prostatitis, GERD (gastroesophageal reflux disease), Hypertension, and Nocturia. here with:  1. Mild memory disturbance   -Mr. Stamper is doing well, remains very independent, highly functional, memory seems to have remained stable, MMSE 30/30 today -Encouraged to get back into exercise program  -Continue Aricept 10 mg in AM -Family History of Dementia -Follow-up in 1 year or sooner if needed  Evangeline Dakin, DNP 06/20/2021, 3:14 PM [facility] [address] [place], [place] [address] [phone]

## 2021-11-05 ENCOUNTER — Encounter: Payer: Self-pay | Admitting: *Deleted

## 2021-11-14 ENCOUNTER — Encounter: Payer: Self-pay | Admitting: *Deleted

## 2021-11-14 NOTE — Patient Instructions (Signed)
  Procedure: colonoscopy  Estimated body mass index is 25.52 kg/m as calculated from the following:   Height as of this encounter: '5\' 11"'$  (1.803 m).   Weight as of this encounter: 183 lb (83 kg).  Have you had a colonoscopy before?  Yes, 09/22/2016  Do you have family history of colon cancer  yes, brother  Do you have a family history of polyps? yes  Previous colonoscopy with polyps removed? yes  Do you have a history colorectal cancer?   No  Are you diabetic?  no  Do you have a prosthetic or mechanical heart valve? no  Do you have a pacemaker/defibrillator?   no  Have you had endocarditis/atrial fibrillation?  no  Do you use supplemental oxygen/CPAP?  no  Have you had joint replacement within the last 12 months?  no  Do you tend to be constipated or have to use laxatives?  no   Do you have history of alcohol use? If yes, how much and how often.  no  Do you have history or are you using drugs? If yes, what do are you  using?  no  Have you ever had a stroke/heart attack?  no  Have you ever had a heart or other vascular stent placed,?no  Do you take weight loss medication? no   Do you take any blood-thinning medications such as: (Plavix, aspirin, Coumadin, Aggrenox, Brilinta, Xarelto, Eliquis, Pradaxa, Savaysa or Effient) No  If yes we need the name, milligram, dosage and who is prescribing doctor:  n/a             Current Outpatient Medications  Medication Sig Dispense Refill   amLODipine (NORVASC) 10 MG tablet Take 10 mg by mouth daily.  3   donepezil (ARICEPT) 10 MG tablet TAKE 1 TABLET BY MOUTH daily 90 tablet 4   meclizine (ANTIVERT) 25 MG tablet Take 25 mg by mouth every 6 (six) hours as needed.     olmesartan (BENICAR) 40 MG tablet Take 20-40 mg by mouth daily.     No current facility-administered medications for this visit.    Allergies  Allergen Reactions   Feldene [Piroxicam] Diarrhea

## 2021-11-15 NOTE — Progress Notes (Signed)
ASA 2, okay to schedule.

## 2021-11-18 NOTE — Progress Notes (Signed)
LMTCB to schedule.

## 2021-11-19 ENCOUNTER — Encounter: Payer: Self-pay | Admitting: *Deleted

## 2021-11-19 MED ORDER — PEG 3350-KCL-NA BICARB-NACL 420 G PO SOLR: 4000.0000 mL | Freq: Once | ORAL | 0 refills | Status: AC

## 2021-11-19 NOTE — Progress Notes (Signed)
Pt has been scheduled for 12/24/21 at 9 am with Dr.Carver, instructions mailed and prep sent to the pharmacy.  APPROVED  Authorization #: [id number] DOS: 12/18/21-01/05/22

## 2021-12-16 ENCOUNTER — Telehealth: Payer: Self-pay | Admitting: *Deleted

## 2021-12-16 NOTE — Telephone Encounter (Signed)
Pt had called and left vm stating that he had a hemorrhoid and wanted to know if he needed an ov or could provider take a look at it during his procedure.   Advised pt that I spoke with provider and that he doesn't need an ov, that it can be looked at during his procedure. Pt verbalized understanding

## 2021-12-24 ENCOUNTER — Ambulatory Visit (HOSPITAL_COMMUNITY): Payer: Medicare Other | Admitting: Anesthesiology

## 2021-12-24 ENCOUNTER — Encounter (HOSPITAL_COMMUNITY): Payer: Self-pay

## 2021-12-24 ENCOUNTER — Other Ambulatory Visit: Payer: Self-pay

## 2021-12-24 ENCOUNTER — Ambulatory Visit (HOSPITAL_COMMUNITY)
Admission: RE | Admit: 2021-12-24 | Discharge: 2021-12-24 | Disposition: A | Discharge status: Home / Self Care | Payer: Medicare Other | Attending: Internal Medicine | Admitting: Internal Medicine

## 2021-12-24 ENCOUNTER — Ambulatory Visit (HOSPITAL_BASED_OUTPATIENT_CLINIC_OR_DEPARTMENT_OTHER): Payer: Medicare Other | Admitting: Anesthesiology

## 2021-12-24 ENCOUNTER — Encounter (HOSPITAL_COMMUNITY)
Admission: RE | Disposition: A | Discharge status: Home / Self Care | Payer: Self-pay | Source: Home / Self Care | Attending: Internal Medicine

## 2021-12-24 DIAGNOSIS — K219 Gastro-esophageal reflux disease without esophagitis: Secondary | ICD-10-CM | POA: Diagnosis not present

## 2021-12-24 DIAGNOSIS — K5730 Diverticulosis of large intestine without perforation or abscess without bleeding: Secondary | ICD-10-CM | POA: Insufficient documentation

## 2021-12-24 DIAGNOSIS — Z86010 Personal history of colonic polyps: Secondary | ICD-10-CM | POA: Diagnosis not present

## 2021-12-24 DIAGNOSIS — Z800 Family history of malignant neoplasm of digestive organs: Secondary | ICD-10-CM | POA: Diagnosis not present

## 2021-12-24 DIAGNOSIS — I10 Essential (primary) hypertension: Secondary | ICD-10-CM | POA: Insufficient documentation

## 2021-12-24 DIAGNOSIS — Z1212 Encounter for screening for malignant neoplasm of rectum: Secondary | ICD-10-CM | POA: Diagnosis not present

## 2021-12-24 DIAGNOSIS — K648 Other hemorrhoids: Secondary | ICD-10-CM | POA: Diagnosis not present

## 2021-12-24 DIAGNOSIS — Z1211 Encounter for screening for malignant neoplasm of colon: Secondary | ICD-10-CM

## 2021-12-24 DIAGNOSIS — Z79899 Other long term (current) drug therapy: Secondary | ICD-10-CM | POA: Diagnosis not present

## 2021-12-24 DIAGNOSIS — K649 Unspecified hemorrhoids: Secondary | ICD-10-CM | POA: Diagnosis not present

## 2021-12-24 DIAGNOSIS — D649 Anemia, unspecified: Secondary | ICD-10-CM | POA: Diagnosis not present

## 2021-12-24 HISTORY — DX: Polyp of colon: K63.5

## 2021-12-24 HISTORY — PX: COLONOSCOPY WITH PROPOFOL: SHX5780

## 2021-12-24 SURGERY — COLONOSCOPY WITH PROPOFOL
Anesthesia: General

## 2021-12-24 MED ORDER — LACTATED RINGERS IV SOLN: INTRAVENOUS | Status: DC

## 2021-12-24 MED ORDER — PROPOFOL 10 MG/ML IV BOLUS
INTRAVENOUS | Status: DC | PRN
  Administered 2021-12-24 (×2): 50 mg via INTRAVENOUS
  Administered 2021-12-24: 100 mg via INTRAVENOUS
  Administered 2021-12-24: 40 mg via INTRAVENOUS
  Administered 2021-12-24: 30 mg via INTRAVENOUS

## 2021-12-24 MED ORDER — LACTATED RINGERS IV SOLN: INTRAVENOUS | Status: DC | PRN

## 2021-12-24 MED ORDER — LIDOCAINE HCL (CARDIAC) PF 100 MG/5ML IV SOSY
PREFILLED_SYRINGE | INTRAVENOUS | Status: DC | PRN
  Administered 2021-12-24: 50 mg via INTRAVENOUS

## 2021-12-24 NOTE — Transfer of Care (Signed)
Immediate Anesthesia Transfer of Care Note  Patient: Stephen Kramer  Procedure(s) Performed: COLONOSCOPY WITH PROPOFOL  Patient Location: Endoscopy Unit  Anesthesia Type:General  Level of Consciousness: drowsy  Airway & Oxygen Therapy: Patient Spontanous Breathing  Post-op Assessment: Report given to RN and Post -op Vital signs reviewed and stable  Post vital signs: Reviewed and stable  Last Vitals:  Vitals Value Taken Time  BP 88/53 12/24/21 0916  Temp 36.7 C 12/24/21 0916  Pulse 72 12/24/21 0916  Resp 18 12/24/21 0916  SpO2 95 % 12/24/21 0916    Last Pain:  Vitals:   12/24/21 0916  TempSrc: Axillary  PainSc:       Patients Stated Pain Goal: 5 (61/51/83 4373)  Complications: No notable events documented.

## 2021-12-24 NOTE — Anesthesia Preprocedure Evaluation (Signed)
Anesthesia Evaluation  Patient identified by MRN, date of birth, ID band Patient awake    Reviewed: Allergy & Precautions, H&P , NPO status , Patient's Chart, lab work & pertinent test results, reviewed documented beta blocker date and time   Airway Mallampati: II  TM Distance: >3 FB Neck ROM: full    Dental no notable dental hx.    Pulmonary neg pulmonary ROS   Pulmonary exam normal breath sounds clear to auscultation       Cardiovascular Exercise Tolerance: Good hypertension, negative cardio ROS  Rhythm:regular Rate:Normal     Neuro/Psych  Headaches  negative psych ROS   GI/Hepatic Neg liver ROS,GERD  Medicated,,  Endo/Other  negative endocrine ROS    Renal/GU negative Renal ROS  negative genitourinary   Musculoskeletal   Abdominal   Peds  Hematology  (+) Blood dyscrasia, anemia   Anesthesia Other Findings   Reproductive/Obstetrics negative OB ROS                             Anesthesia Physical Anesthesia Plan  ASA: 2  Anesthesia Plan: General   Post-op Pain Management:    Induction:   PONV Risk Score and Plan: Propofol infusion  Airway Management Planned:   Additional Equipment:   Intra-op Plan:   Post-operative Plan:   Informed Consent: I have reviewed the patients History and Physical, chart, labs and discussed the procedure including the risks, benefits and alternatives for the proposed anesthesia with the patient or authorized representative who has indicated his/her understanding and acceptance.     Dental Advisory Given  Plan Discussed with: CRNA  Anesthesia Plan Comments:        Anesthesia Quick Evaluation

## 2021-12-24 NOTE — H&P (Signed)
Primary Care Physician:  Sharilyn Sites, MD Primary Gastroenterologist:  Dr. Abbey Chatters  Pre-Procedure History & Physical: HPI:  Stephen Kramer is a 71 y.o. male is here for a colonoscopy to be performed for high risk colon cancer screening purposes, family history of colon cancer in brother  Past Medical History:  Diagnosis Date   Anemia    Chest pain    a. 02/2014 MV: EF 67%, no ischemia/infarct.   Chronic prostatitis    Colon polyps    GERD (gastroesophageal reflux disease)    not bad   Hypertension    Nocturia     Past Surgical History:  Procedure Laterality Date   ANTERIOR CERVICAL DECOMP/DISCECTOMY FUSION N/A 11/21/2016   Procedure: C4-5, C5-6 ANTERIOR CERVICAL DISCECTOMY AND FUSION, ALLOGRAFT, PLATE;  Surgeon: Marybelle Killings, MD;  Location: [facility];  Service: Orthopedics;  Laterality: N/A;   COLONOSCOPY     COLONOSCOPY N/A 09/22/2016   Procedure: COLONOSCOPY;  Surgeon: Danie Binder, MD;  [facility];  Service: Endoscopy;  Laterality: N/A;  1:45pm   COLONOSCOPY     HERNIA REPAIR     triple hernia surgery    Prior to Admission medications   Medication Sig Start Date End Date Taking? Authorizing Provider  acetaminophen (TYLENOL) 500 MG tablet Take 1,000 mg by mouth every 6 (six) hours as needed (pain.).   Yes [provider]  amLODipine (NORVASC) 10 MG tablet Take 10 mg by mouth every evening. 11/14/15  Yes [provider]  cetirizine (ZYRTEC) 10 MG tablet Take 10 mg by mouth daily.   Yes [provider]  Coenzyme Q10-Vitamin E (QUNOL ULTRA COQ10 PO) Take 1 capsule by mouth daily.   Yes [provider]  donepezil (ARICEPT) 10 MG tablet TAKE 1 TABLET BY MOUTH daily Patient taking differently: Take 10 mg by mouth daily after supper. 06/20/21  Yes Suzzanne Cloud, NP  meclizine (ANTIVERT) 25 MG tablet Take 25 mg by mouth 2 (two) times daily as needed for nausea. 03/07/19  Yes [provider]  Multiple Vitamin (MULTIVITAMIN WITH  MINERALS) TABS tablet Take 1 tablet by mouth daily.   Yes [provider]  naproxen sodium (ALEVE) 220 MG tablet Take 440 mg by mouth 2 (two) times daily as needed (pain.).   Yes [provider]  olmesartan (BENICAR) 40 MG tablet Take 20 mg by mouth in the morning. 02/18/21  Yes [provider]  tadalafil (CIALIS) 5 MG tablet Take 5 mg by mouth daily as needed for erectile dysfunction.   Yes [provider]    Allergies as of 11/19/2021 - Review Complete 11/14/2021  Allergen Reaction Noted   Feldene [piroxicam] Diarrhea 02/10/2014    Family History  Problem Relation Age of Onset   Colon cancer Brother    Hypertension Mother    Diabetes Mother    Dementia Mother    Hypertension Father    Dementia Father    Diabetes Sister    Diabetes Brother     Social History   Socioeconomic History   Marital status: Married    Spouse name: Deneise Lever   Number of children: 0   Years of education: Not on file   Highest education level: High school graduate  Occupational History   Occupation: Retired Armed forces operational officer: [employer]  Tobacco Use   Smoking status: Never   Smokeless tobacco: Never  Vaping Use   Vaping Use: Never used  Substance and Sexual Activity  Alcohol use: Yes    Alcohol/week: 1.0 - 4.0 standard drink of alcohol    Types: 1 - 4 Cans of beer per week   Drug use: No   Sexual activity: Yes  Other Topics Concern   Not on file  Social History Narrative   Lives at home with wife   Right handed   2 cups of coffee daily   Social Determinants of Health   Financial Resource Strain: Not on file  Food Insecurity: Not on file  Transportation Needs: Not on file  Physical Activity: Not on file  Stress: Not on file  Social Connections: Not on file  Intimate Partner Violence: Not on file    Review of Systems: See HPI, otherwise negative ROS  Physical Exam: Vital signs in last 24 hours: Temp:  [98.8 F (37.1 C)] 98.8 F  (37.1 C) (12/19 0738) Pulse Rate:  [60] 60 (12/19 0738) Resp:  [18] 18 (12/19 0738) BP: (131)/(76) 131/76 (12/19 0738) SpO2:  [100 %] 100 % (12/19 0738) Weight:  [81.6 kg] 81.6 kg (12/19 0738)   General:   Alert,  Well-developed, well-nourished, pleasant and cooperative in NAD Head:  Normocephalic and atraumatic. Eyes:  Sclera clear, no icterus.   Conjunctiva pink. Ears:  Normal auditory acuity. Nose:  No deformity, discharge,  or lesions. Mouth:  No deformity or lesions, dentition normal. Neck:  Supple; no masses or thyromegaly. Lungs:  Clear throughout to auscultation.   No wheezes, crackles, or rhonchi. No acute distress. Heart:  Regular rate and rhythm; no murmurs, clicks, rubs,  or gallops. Abdomen:  Soft, nontender and nondistended. No masses, hepatosplenomegaly or hernias noted. Normal bowel sounds, without guarding, and without rebound.   Msk:  Symmetrical without gross deformities. Normal posture. Extremities:  Without clubbing or edema. Neurologic:  Alert and  oriented x4;  grossly normal neurologically. Skin:  Intact without significant lesions or rashes. Cervical Nodes:  No significant cervical adenopathy. Psych:  Alert and cooperative. Normal mood and affect.  Impression/Plan: Stephen Kramer is here for a colonoscopy to be performed for high risk colon cancer screening purposes, family history of colon cancer in brother  The risks of the procedure including infection, bleed, or perforation as well as benefits, limitations, alternatives and imponderables have been reviewed with the patient. Questions have been answered. All parties agreeable.

## 2021-12-24 NOTE — Discharge Instructions (Addendum)
  Colonoscopy Discharge Instructions  Read the instructions outlined below and refer to this sheet in the next few weeks. These discharge instructions provide you with general information on caring for yourself after you leave the hospital. Your doctor may also give you specific instructions. While your treatment has been planned according to the most current medical practices available, unavoidable complications occasionally occur.   ACTIVITY You may resume your regular activity, but move at a slower pace for the next 24 hours.  Take frequent rest periods for the next 24 hours.  Walking will help get rid of the air and reduce the bloated feeling in your belly (abdomen).  No driving for 24 hours (because of the medicine (anesthesia) used during the test).   Do not sign any important legal documents or operate any machinery for 24 hours (because of the anesthesia used during the test).  NUTRITION Drink plenty of fluids.  You may resume your normal diet as instructed by your doctor.  Begin with a light meal and progress to your normal diet. Heavy or fried foods are harder to digest and may make you feel sick to your stomach (nauseated).  Avoid alcoholic beverages for 24 hours or as instructed.  MEDICATIONS You may resume your normal medications unless your doctor tells you otherwise.  WHAT YOU CAN EXPECT TODAY Some feelings of bloating in the abdomen.  Passage of more gas than usual.  Spotting of blood in your stool or on the toilet paper.  IF YOU HAD POLYPS REMOVED DURING THE COLONOSCOPY: No aspirin products for 7 days or as instructed.  No alcohol for 7 days or as instructed.  Eat a soft diet for the next 24 hours.  FINDING OUT THE RESULTS OF YOUR TEST Not all test results are available during your visit. If your test results are not back during the visit, make an appointment with your caregiver to find out the results. Do not assume everything is normal if you have not heard from your  caregiver or the medical facility. It is important for you to follow up on all of your test results.  SEEK IMMEDIATE MEDICAL ATTENTION IF: You have more than a spotting of blood in your stool.  Your belly is swollen (abdominal distention).  You are nauseated or vomiting.  You have a temperature over 101.  You have abdominal pain or discomfort that is severe or gets worse throughout the day.   Your colonoscopy was relatively unremarkable.  I did not find any polyps or evidence of colon cancer.  I recommend repeating colonoscopy in 5 years for colon cancer screening purposes.  You do have diverticulosis and internal hemorrhoids. I would recommend increasing fiber in your diet or adding OTC Benefiber/Metamucil. Be sure to drink at least 4 to 6 glasses of water daily. Follow-up with GI as needed.   I hope you have a great rest of your week!  Elon Alas. Abbey Chatters, D.O. Gastroenterology and Hepatology [facility]

## 2021-12-24 NOTE — Op Note (Signed)
[facility] Patient Name: Stephen Kramer Procedure Date: 12/24/2021 8:57 AM MRN: [id number] Date of Birth: August 05, 1950 Attending MD: Elon Alas. Abbey Chatters , [place], [id number] CSN: [id number] Age: 71 Admit Type: Outpatient Procedure:                Colonoscopy Indications:              Colon cancer screening in patient at increased                            risk: Colorectal cancer in brother Providers:                Elon Alas. Abbey Chatters, DO, Caprice Kluver, Raphael Gibney,                            Technician Referring MD:              Medicines:                See the Anesthesia note for documentation of the                            administered medications Complications:            No immediate complications. Estimated Blood Loss:     Estimated blood loss: none. Procedure:                Pre-Anesthesia Assessment:                           - The anesthesia plan was to use monitored                            anesthesia care (MAC).                           After obtaining informed consent, the colonoscope                            was passed under direct vision. Throughout the                            procedure, the patient's blood pressure, pulse, and                            oxygen saturations were monitored continuously. The                            [id number]-HQ190L ([id number]) scope was introduced through                            the anus and advanced to the the cecum, identified                            by appendiceal orifice and ileocecal valve. The                            colonoscopy was performed without difficulty. The  patient tolerated the procedure well. The quality                            of the bowel preparation was evaluated using the                            BBPS [facility] Bowel Preparation Scale) with scores                            of: Right Colon = 3, Transverse Colon = 3 and Left                            Colon = 3 (entire mucosa  seen well with no residual                            staining, small fragments of stool or opaque                            liquid). The total BBPS score equals 9. Scope In: 9:05:19 AM Scope Out: 9:14:19 AM Scope Withdrawal Time: 0 hours 6 minutes 56 seconds  Total Procedure Duration: 0 hours 9 minutes 0 seconds  Findings:      Hemorrhoids were found on perianal exam.      Non-bleeding internal hemorrhoids were found during retroflexion.      Many large-mouthed and small-mouthed diverticula were found in the       sigmoid colon and descending colon.      The exam was otherwise without abnormality. Impression:               - Hemorrhoids found on perianal exam.                           - Non-bleeding internal hemorrhoids.                           - Diverticulosis in the sigmoid colon and in the                            descending colon.                           - The examination was otherwise normal.                           - No specimens collected. Moderate Sedation:      Per Anesthesia Care Recommendation:           - Patient has a contact number available for                            emergencies. The signs and symptoms of potential                            delayed complications were discussed with the  patient. Return to normal activities tomorrow.                            Written discharge instructions were provided to the                            patient.                           - Resume previous diet.                           - Continue present medications.                           - Repeat colonoscopy in 5 years for screening                            purposes.                           - Return to GI clinic PRN. Procedure Code(s):        --- Professional ---                           B7628, Colorectal cancer screening; colonoscopy on                            individual at high risk Diagnosis Code(s):        --- Professional  ---                           Z80.0, Family history of malignant neoplasm of                            digestive organs                           K64.8, Other hemorrhoids                           K57.30, Diverticulosis of large intestine without                            perforation or abscess without bleeding CPT copyright 2022 American Medical Association. All rights reserved. The codes documented in this report are preliminary and upon coder review may  be revised to meet current compliance requirements. Elon Alas. Abbey Chatters, DO [place] Abbey Chatters, DO 12/24/2021 9:16:26 AM This report has been signed electronically. Number of Addenda: 0

## 2021-12-26 NOTE — Anesthesia Postprocedure Evaluation (Signed)
Anesthesia Post Note  Patient: Stephen Kramer  Procedure(s) Performed: COLONOSCOPY WITH PROPOFOL  Patient location during evaluation: Phase II Anesthesia Type: General Level of consciousness: awake Pain management: pain level controlled Vital Signs Assessment: post-procedure vital signs reviewed and stable Respiratory status: spontaneous breathing and respiratory function stable Cardiovascular status: blood pressure returned to baseline and stable Postop Assessment: no headache and no apparent nausea or vomiting Anesthetic complications: no Comments: Late entry   No notable events documented.   Last Vitals:  Vitals:   12/24/21 0920 12/24/21 0923  BP: (!) 90/53 (!) 99/56  Pulse: 68 74  Resp: 16 18  Temp:    SpO2: 95% 96%    Last Pain:  Vitals:   12/24/21 0923  TempSrc:   PainSc: 0-No pain                 Louann Sjogren

## 2022-01-01 ENCOUNTER — Encounter (HOSPITAL_COMMUNITY): Payer: Self-pay | Admitting: Internal Medicine

## 2022-01-23 ENCOUNTER — Ambulatory Visit (INDEPENDENT_AMBULATORY_CARE_PROVIDER_SITE_OTHER): Payer: Medicare Other | Admitting: Gastroenterology

## 2022-01-23 VITALS — BP 145/84 | HR 63 | Temp 97.4°F | Ht 71.0 in | Wt 181.8 lb

## 2022-01-23 DIAGNOSIS — K642 Third degree hemorrhoids: Secondary | ICD-10-CM

## 2022-01-23 NOTE — Patient Instructions (Signed)
  Please avoid straining.  You should limit your toilet time to 2-3 minutes at the most.   I recommend Benefiber 2 teaspoons each morning in the beverage of your choice!  Please call me with any concerns or issues!  I will see you in follow-up for additional banding in several weeks.     It was a pleasure to see you today. I want to create trusting relationships with patients and provide genuine, compassionate, and quality care. I truly value your feedback, so please be on the lookout for a survey regarding your visit with me today. I appreciate your time in completing this!    Annitta Needs, PhD, ANP-BC [facility]

## 2022-01-23 NOTE — Progress Notes (Signed)
      [place] BANDING PROCEDURE NOTE  Stephen Kramer is a 72 y.o. male presenting today for consideration of hemorrhoid banding. Last colonoscopy Dec 2023: non-bleeding internal hemorrhoids, otherwise normal. He notes pressure, itching, burning, prolapse.    The patient presents with symptomatic grade 3 hemorrhoids, unresponsive to maximal medical therapy, requesting rubber band ligation of his hemorrhoidal disease. All risks, benefits, and alternative forms of therapy were described and informed consent was obtained.   The decision was made to band the left lateral  internal hemorrhoid, and the [place] was used to perform band ligation without complication. Digital anorectal examination was then performed to assure proper positioning of the band, and to adjust the banded tissue as required. The patient was discharged home without pain or other issues. Dietary and behavioral recommendations were given, along with follow-up instructions. The patient will return in several weeks for followup and possible additional banding as required.  No complications were encountered and the patient tolerated the procedure well.   Stephen Needs, PhD, ANP-BC [facility]

## 2022-02-11 ENCOUNTER — Ambulatory Visit (INDEPENDENT_AMBULATORY_CARE_PROVIDER_SITE_OTHER): Payer: Medicare Other | Admitting: Gastroenterology

## 2022-02-11 ENCOUNTER — Encounter: Payer: Self-pay | Admitting: Gastroenterology

## 2022-02-11 VITALS — BP 127/75 | HR 58 | Temp 98.2°F | Ht 71.0 in | Wt 181.4 lb

## 2022-02-11 DIAGNOSIS — K642 Third degree hemorrhoids: Secondary | ICD-10-CM

## 2022-02-11 NOTE — Progress Notes (Signed)
    [place] BANDING PROCEDURE NOTE  Stephen Kramer is a 72 y.o. male presenting today for consideration of hemorrhoid banding. Last colonoscopy Dec 2023: non-bleeding internal hemorrhoids, otherwise normal. He notes pressure, itching.  He has had left lateral banding thus far. Some improvement in symptoms since first banding.    The patient presents with symptomatic grade 3 hemorrhoids, unresponsive to maximal medical therapy, requesting rubber band ligation of his hemorrhoidal disease. All risks, benefits, and alternative forms of therapy were described and informed consent was obtained.   The decision was made to band the right anterior internal hemorrhoid, and the [place] was used to perform band ligation without complication. Digital anorectal examination was then performed to assure proper positioning of the band, and to adjust the banded tissue as required. The patient was discharged home without pain or other issues. Dietary and behavioral recommendations were given, along with follow-up instructions. The patient will return in several weeks for followup and possible additional banding as required.  No complications were encountered and the patient tolerated the procedure well.   Annitta Needs, PhD, ANP-BC [facility]

## 2022-02-11 NOTE — Patient Instructions (Signed)
  Please avoid straining.  You should limit your toilet time to 2-3 minutes at the most.   I recommend Benefiber 2 teaspoons each morning in the beverage of your choice!  Please call me with any concerns or issues!  I will see you in follow-up for additional banding in several weeks.    I enjoyed seeing you again today! At our first visit, I mentioned how I value our relationship and want to provide genuine, compassionate, and quality care. You may receive a survey regarding your visit with me, and I welcome your feedback! Thanks so much for taking the time to complete this. I look forward to seeing you again.   Annitta Needs, PhD, ANP-BC [facility]

## 2022-03-13 ENCOUNTER — Encounter: Payer: Self-pay | Admitting: Gastroenterology

## 2022-03-13 ENCOUNTER — Ambulatory Visit: Payer: Medicare Other | Admitting: Gastroenterology

## 2022-03-13 VITALS — BP 126/75 | HR 60 | Temp 97.5°F | Ht 71.0 in | Wt 185.5 lb

## 2022-03-13 DIAGNOSIS — K642 Third degree hemorrhoids: Secondary | ICD-10-CM

## 2022-03-13 NOTE — Progress Notes (Signed)
    [place] BANDING PROCEDURE NOTE  Stephen Kramer is a 72 y.o. male presenting today for consideration of hemorrhoid banding. Last colonoscopy Dec 2023: non-bleeding internal hemorrhoids, otherwise normal. He has had left lateral and right anterior hemorrhoid banding. He has noted improvement overall. No further prolapsing.    The patient presents with symptomatic grade 2-3 hemorrhoids, unresponsive to maximal medical therapy, requesting rubber band ligation of his hemorrhoidal disease. All risks, benefits, and alternative forms of therapy were described and informed consent was obtained.  The decision was made to band the right posterior internal hemorrhoid, and the [place] was used to perform band ligation without complication. Digital anorectal examination was then performed to assure proper positioning of the band, and to adjust the banded tissue as required. The patient was discharged home without pain or other issues. Dietary and behavioral recommendations were given, along with follow-up instructions. The patient will return as needed.   No complications were encountered and the patient tolerated the procedure well.   Annitta Needs, PhD, ANP-BC [facility]

## 2022-03-13 NOTE — Patient Instructions (Signed)
  Please avoid straining.  You should limit your toilet time to 2-3 minutes at the most.   I recommend Benefiber 2 teaspoons each morning in the beverage of your choice!  Please call me with any concerns or issues!  I will see you  back as needed!  I enjoyed seeing you again today! At our first visit, I mentioned how I value our relationship and want to provide genuine, compassionate, and quality care. You may receive a survey regarding your visit with me, and I welcome your feedback! Thanks so much for taking the time to complete this. I look forward to seeing you again.   Annitta Needs, PhD, ANP-BC [facility]

## 2022-03-14 DIAGNOSIS — E7849 Other hyperlipidemia: Secondary | ICD-10-CM | POA: Diagnosis not present

## 2022-03-14 DIAGNOSIS — J309 Allergic rhinitis, unspecified: Secondary | ICD-10-CM | POA: Diagnosis not present

## 2022-03-14 DIAGNOSIS — I10 Essential (primary) hypertension: Secondary | ICD-10-CM | POA: Diagnosis not present

## 2022-03-14 DIAGNOSIS — E782 Mixed hyperlipidemia: Secondary | ICD-10-CM | POA: Diagnosis not present

## 2022-03-14 DIAGNOSIS — G4733 Obstructive sleep apnea (adult) (pediatric): Secondary | ICD-10-CM | POA: Diagnosis not present

## 2022-03-14 DIAGNOSIS — Z6824 Body mass index (BMI) 24.0-24.9, adult: Secondary | ICD-10-CM | POA: Diagnosis not present

## 2022-03-14 DIAGNOSIS — Z0001 Encounter for general adult medical examination with abnormal findings: Secondary | ICD-10-CM | POA: Diagnosis not present

## 2022-05-09 DIAGNOSIS — H43393 Other vitreous opacities, bilateral: Secondary | ICD-10-CM | POA: Diagnosis not present

## 2022-05-09 DIAGNOSIS — H2513 Age-related nuclear cataract, bilateral: Secondary | ICD-10-CM | POA: Diagnosis not present

## 2022-06-24 ENCOUNTER — Ambulatory Visit: Payer: Medicare Other | Admitting: Neurology

## 2022-06-24 ENCOUNTER — Encounter: Payer: Self-pay | Admitting: Neurology

## 2022-06-24 VITALS — BP 144/73 | HR 81 | Ht 70.0 in | Wt 179.0 lb

## 2022-06-24 DIAGNOSIS — R413 Other amnesia: Secondary | ICD-10-CM

## 2022-06-24 MED ORDER — DONEPEZIL HCL 10 MG PO TABS: ORAL_TABLET | ORAL | 4 refills | Status: DC

## 2022-06-24 NOTE — Patient Instructions (Signed)
Memory is stable, continue Aricept Consider ATN profile testing from [facility]: [email or web address]

## 2022-06-24 NOTE — Progress Notes (Signed)
PATIENT: Stephen Kramer DOB: Jul 27, 1950  REASON FOR VISIT: follow up for mild memory disturbance HISTORY FROM: patient PRIMARY NEUROLOGIST: Dr. Dalia Heading. [place]  HISTORY OF PRESENT ILLNESS: Today 06/24/22  MMSE 29/30. Feels memory is stable. Gets frustrated with himself if he forgets things. He has forgetfulness, went off without his jacket today, wanted to bring with him. Remains on Aricept, takes in the morning, gave him dreams at night. He basically lives alone, his wife is a Scientist, clinical (histocompatibility and immunogenetics) and travels from job. He manages the household, yard work, putting a new motor in his truck. He is not sedentary. No health issues.  No changes in memory.  06/20/21 SS: Stephen Kramer is here today for follow-up. Doing well. MMSE 30/30. Memory is doing well. His wife is a Scientist, clinical (histocompatibility and immunogenetics) she is on an out of town work Higher education careers adviser, he lives by himself. He does well. He drives without problem. Health is good. Tolerating Aricept well, thinks it is helping. Sleeps well. Needs to get back into the gym. He is independent. His wife likes him to come annually for evaluation.   06/20/20 SS: Stephen Kramer is a 72 year old male with history of memory disturbance. MMSE 30/30 today. Remains on Aricept 10 mg AM. Forgets some things, such as his mask before leaving the house. Lives with his wife, does his own ADLs, pays the bills, does medications. No health issues. Feels like Aricept is helping. Vertigo is doing well, takes 1-2 pills as needed. Went to [facility] for 1 week recently, his wife is a Scientist, clinical (histocompatibility and immunogenetics). Both his parents had Dementia. Doing well today.  03/22/2019 SS: Stephen Kramer is a 72 year old male with history of memory disturbance. He takes Aricept 10 mg in the morning.  Switching it to the morning, has helped his sleeping.  He thinks his memory is better since last seen.  He continues to remain quite active, does a lot of projects in the yard.  He drives a car without difficulty.  He has trouble which short-term memory, and remembering  people's names.  He has vertigo, takes meclizine.  He is overall doing well, denies any new problems or concerns.  Presents today for follow-up unaccompanied. BP elevated, reports got upset on the drive in from [place].  HISTORY 09/16/2018 SS: Stephen Kramer is a 72 year old male with history of memory disturbance. He feels his memory is stable. He has lost 12 lbs, but he has been trying to with diet and exercise. He lives with his wife. He is very active working in the yard, and doing house upkeep. He is currently insulating his garage. He drives a car well. He has noticed that he will go to sleep well at 10 pm, but wakes up around 2-3 am and can't go back to sleep. He feels his memory has improved and his focus is better. He continue to struggle with short term memory. His health has been good. He manages his finances well.  He presents today for follow-up unaccompanied.   REVIEW OF SYSTEMS: Out of a complete 14 system review of symptoms, the patient complains only of the following symptoms, and all other reviewed systems are negative.  See HPI  ALLERGIES: Allergies  Allergen Reactions   Feldene [Piroxicam] Diarrhea    HOME MEDICATIONS: Outpatient Medications Prior to Visit  Medication Sig Dispense Refill   acetaminophen (TYLENOL) 500 MG tablet Take 1,000 mg by mouth every 6 (six) hours as needed (pain.).     amLODipine (NORVASC) 10 MG tablet Take 10 mg by mouth every  evening.  3   cetirizine (ZYRTEC) 10 MG tablet Take 10 mg by mouth daily.     Coenzyme Q10-Vitamin E (QUNOL ULTRA COQ10 PO) Take 1 capsule by mouth daily.     donepezil (ARICEPT) 10 MG tablet TAKE 1 TABLET BY MOUTH daily (Patient taking differently: Take 10 mg by mouth daily after supper.) 90 tablet 4   meclizine (ANTIVERT) 25 MG tablet Take 25 mg by mouth 2 (two) times daily as needed for nausea.     Multiple Vitamin (MULTIVITAMIN WITH MINERALS) TABS tablet Take 1 tablet by mouth daily.     naproxen sodium (ALEVE) 220 MG  tablet Take 440 mg by mouth 2 (two) times daily as needed (pain.).     olmesartan (BENICAR) 40 MG tablet Take 20 mg by mouth in the morning.     tadalafil (CIALIS) 5 MG tablet Take 5 mg by mouth daily as needed for erectile dysfunction.     No facility-administered medications prior to visit.    PAST MEDICAL HISTORY: Past Medical History:  Diagnosis Date   Anemia    Chest pain    a. 02/2014 MV: EF 67%, no ischemia/infarct.   Chronic prostatitis    Colon polyps    GERD (gastroesophageal reflux disease)    not bad   Hypertension    Nocturia     PAST SURGICAL HISTORY: Past Surgical History:  Procedure Laterality Date   ANTERIOR CERVICAL DECOMP/DISCECTOMY FUSION N/A 11/21/2016   Procedure: C4-5, C5-6 ANTERIOR CERVICAL DISCECTOMY AND FUSION, ALLOGRAFT, PLATE;  Surgeon: Eldred Manges, MD;  [facility];  Service: Orthopedics;  Laterality: N/A;   COLONOSCOPY     COLONOSCOPY N/A 09/22/2016   Procedure: COLONOSCOPY;  Surgeon: West Bali, MD;  [facility];  Service: Endoscopy;  Laterality: N/A;  1:45pm   COLONOSCOPY     COLONOSCOPY WITH PROPOFOL N/A 12/24/2021   Procedure: COLONOSCOPY WITH PROPOFOL;  Surgeon: Lanelle Bal, DO;  [facility];  Service: Endoscopy;  Laterality: N/A;  9:00 am   HERNIA REPAIR     triple hernia surgery    FAMILY HISTORY: Family History  Problem Relation Age of Onset   Colon cancer Brother    Hypertension Mother    Diabetes Mother    Dementia Mother    Hypertension Father    Dementia Father    Diabetes Sister    Diabetes Brother     SOCIAL HISTORY: Social History   Socioeconomic History   Marital status: Married    Spouse name: Pattricia Boss   Number of children: 0   Years of education: Not on file   Highest education level: High school graduate  Occupational History   Occupation: Retired Advertising account executive: [employer]  Tobacco Use   Smoking status: Never   Smokeless tobacco: Never  Vaping Use    Vaping Use: Never used  Substance and Sexual Activity   Alcohol use: Yes    Alcohol/week: 1.0 - 4.0 standard drink of alcohol    Types: 1 - 4 Cans of beer per week   Drug use: No   Sexual activity: Yes  Other Topics Concern   Not on file  Social History Narrative   Lives at home with wife   Right handed   2 cups of coffee daily   Social Determinants of Health   Financial Resource Strain: Not on file  Food Insecurity: Not on file  Transportation Needs: Not on file  Physical Activity: Not on  file  Stress: Not on file  Social Connections: Not on file  Intimate Partner Violence: Not on file   PHYSICAL EXAM  Vitals:   06/24/22 1355 06/24/22 1401  BP: (!) 156/74 (!) 144/73  Pulse: 81 81  Weight: 179 lb (81.2 kg)   Height: 5\' 10"  (1.778 m)     Body mass index is 25.68 kg/m.  Generalized: Well developed, in no acute distress     06/24/2022    1:57 PM 06/20/2021    2:55 PM 06/20/2020    2:00 PM  MMSE - Mini Mental State Exam  Orientation to time 5 5 5   Orientation to Place 5 5 5   Registration 3 3 3   Attention/ Calculation 5 5 5   Recall 3 3 3   Language- name 2 objects 2 2 2   Language- repeat 1  1  Language- follow 3 step command 2  3  Language- read & follow direction 1  1  Write a sentence 1  1  Copy design 1  1  Total score 29  30    Neurological examination  Mentation: Alert oriented to time, place, history taking. Follows all commands speech and language fluent Cranial nerve II-XII: Pupils were equal round reactive to light. Extraocular movements were full, visual field were full on confrontational test. Facial sensation and strength were normal. Head turning and shoulder shrug  were normal and symmetric. Motor: The motor testing reveals 5 over 5 strength of all 4 extremities. Good symmetric motor tone is noted throughout.  Sensory: Sensory testing is intact to soft touch on all 4 extremities. No evidence of extinction is noted.  Coordination: Cerebellar testing  reveals good finger-nose-finger and heel-to-shin bilaterally.  Gait and station: Gait is normal. Independent Reflexes: Deep tendon reflexes are symmetric and normal bilaterally.   DIAGNOSTIC DATA (LABS, IMAGING, TESTING) - I reviewed patient records, labs, notes, testing and imaging myself where available.  Lab Results  Component Value Date   WBC 5.1 11/19/2016   HGB 14.5 11/19/2016   HCT 43.9 11/19/2016   MCV 88.2 11/19/2016   PLT 186 11/19/2016      Component Value Date/Time   NA 136 11/19/2016 1400   K 3.6 11/19/2016 1400   CL 106 11/19/2016 1400   CO2 20 (L) 11/19/2016 1400   GLUCOSE 96 11/19/2016 1400   BUN 9 11/19/2016 1400   CREATININE 0.91 11/19/2016 1400   CALCIUM 9.1 11/19/2016 1400   PROT 7.1 11/19/2016 1400   ALBUMIN 3.9 11/19/2016 1400   AST 27 11/19/2016 1400   ALT 25 11/19/2016 1400   ALKPHOS 71 11/19/2016 1400   BILITOT 2.0 (H) 11/19/2016 1400   GFRNONAA >60 11/19/2016 1400   GFRAA >60 11/19/2016 1400   Lab Results  Component Value Date   CHOL 116 02/11/2014   HDL 39 (L) 02/11/2014   LDLCALC 67 02/11/2014   TRIG 49 02/11/2014   CHOLHDL 3.0 02/11/2014   No results found for: "HGBA1C" Lab Results  Component Value Date   VITAMINB12 632 03/16/2018   Lab Results  Component Value Date   TSH 1.01 06/25/2010   ASSESSMENT AND PLAN 72 y.o. year old male  has a past medical history of Anemia, Chest pain, Chronic prostatitis, Colon polyps, GERD (gastroesophageal reflux disease), Hypertension, and Nocturia. here with:  1. Mild memory disturbance   -Memory remains very stable, MMSE 29/30, no major reported changes -We will continue Aricept 10 mg daily -Has strong family history of dementia, we discussed ordering ATN profile for screening  of Alzheimer's, he is going to think about it, let me know.  He has been a patient at our office since 2020 with memory complaints, he has remained very stable during that time. -Follow-up in 1 year or sooner if needed, if  ATN profile is negative return here PRN  Margie Ege, AGNP-C, DNP 06/24/2022, 2:14 PM [facility] [address] [place], [place] [address] [phone]

## 2022-07-24 ENCOUNTER — Other Ambulatory Visit (INDEPENDENT_AMBULATORY_CARE_PROVIDER_SITE_OTHER): Payer: Medicare Other

## 2022-07-24 ENCOUNTER — Encounter: Payer: Self-pay | Admitting: Orthopaedic Surgery

## 2022-07-24 ENCOUNTER — Ambulatory Visit (INDEPENDENT_AMBULATORY_CARE_PROVIDER_SITE_OTHER): Payer: Medicare Other | Admitting: Orthopaedic Surgery

## 2022-07-24 VITALS — Ht 70.0 in | Wt 179.0 lb

## 2022-07-24 DIAGNOSIS — M542 Cervicalgia: Secondary | ICD-10-CM

## 2022-07-24 DIAGNOSIS — Z981 Arthrodesis status: Secondary | ICD-10-CM

## 2022-07-24 MED ORDER — PREDNISONE 10 MG (21) PO TBPK: ORAL_TABLET | ORAL | 0 refills | Status: DC

## 2022-07-24 NOTE — Progress Notes (Unsigned)
Office Visit Note   Patient: Stephen Kramer           Date of Birth: 04-02-1950           MRN: [id number] Visit Date: 07/24/2022              Requested by: Assunta Found, MD [address] [place],  [place] [address] PCP: Assunta Found, MD   Assessment & Plan: Visit Diagnoses:  1. Neck pain   2. Status post cervical spinal fusion     Plan: Medrol Dosepak sent in.  X-rays reviewed with the patient.  Follow-Up Instructions: Return in about 2 months (around 09/24/2022).   Orders:  Orders Placed This Encounter  Procedures   XR Cervical Spine 2 or 3 views   Meds ordered this encounter  Medications   predniSONE (STERAPRED UNI-PAK 21 TAB) 10 MG (21) TBPK tablet    Sig: Take 6,5,4,3,2,1 one tablet less each day , take with food    Dispense:  21 tablet    Refill:  0      Procedures: No procedures performed   Clinical Data: No additional findings.   Subjective: Chief Complaint  Patient presents with   Neck - Pain    HPI 72 year old male previous two-level cervical fusion C4-5 and C5-6 2018.  He has had recurrence of neck pain for couple months with no known injury.  He states previously we discussed additional level that might give him problems down the road and now at 6 years after neck fusion he is having right-sided neck pain that radiates into his shoulder sometimes a headache posteriorly on the right.  This past week he is pushing himself up with his arms out of the chair felt sharp pain in his left forearm and his left thumb.  Problems moving his thumb with cramping.  States his neck has been stiff he has been using his collar off-and-on which helps to some degree.  He has used Aleve as well as Tylenol.  No gout problems no myelopathic problems no balance problems.  Review of Systems all systems updated unchanged.   Objective: Vital Signs: Ht 5\' 10"  (1.778 m)   Wt 179 lb (81.2 kg)   BMI 25.68 kg/m   Physical Exam Constitutional:      Appearance: He is  well-developed.  HENT:     Head: Normocephalic and atraumatic.     Right Ear: External ear normal.     Left Ear: External ear normal.  Eyes:     Pupils: Pupils are equal, round, and reactive to light.  Neck:     Thyroid: No thyromegaly.     Trachea: No tracheal deviation.  Cardiovascular:     Rate and Rhythm: Normal rate.  Pulmonary:     Effort: Pulmonary effort is normal.  Abdominal:     General: Bowel sounds are normal.     Palpations: Abdomen is soft.  Musculoskeletal:     Cervical back: Neck supple.  Skin:    General: Skin is warm and dry.     Capillary Refill: Capillary refill takes less than 2 seconds.  Neurological:     Mental Status: He is alert and oriented to person, place, and time.  Psychiatric:        Behavior: Behavior normal.        Thought Content: Thought content normal.        Judgment: Judgment normal.     Ortho Exam  Specialty Comments:  No specialty comments available.  Imaging:  No results found.   PMFS History: Patient Active Problem List   Diagnosis Date Noted   Prolapsed internal hemorrhoids, grade 3 02/11/2022   Memory disturbance 09/16/2018   Status post cervical spinal fusion 01/16/2017   Family history of colon cancer    Lipoma of right upper extremity 07/03/2016   Soft tissue mass 06/22/2016   Other headache syndrome 12/05/2015   Pain in the chest    Unstable angina (HCC) 02/11/2014   Anemia 02/11/2014   Chest pain 02/10/2014   RHINITIS 06/26/2009   Osteoarthritis 06/26/2009   NOCTURIA 06/26/2009   PROSTATITIS, CHRONIC, HX OF 04/14/2007   Past Medical History:  Diagnosis Date   Anemia    Chest pain    a. 02/2014 MV: EF 67%, no ischemia/infarct.   Chronic prostatitis    Colon polyps    GERD (gastroesophageal reflux disease)    not bad   Hypertension    Nocturia     Family History  Problem Relation Age of Onset   Colon cancer Brother    Hypertension Mother    Diabetes Mother    Dementia Mother    Hypertension  Father    Dementia Father    Diabetes Sister    Diabetes Brother     Past Surgical History:  Procedure Laterality Date   ANTERIOR CERVICAL DECOMP/DISCECTOMY FUSION N/A 11/21/2016   Procedure: C4-5, C5-6 ANTERIOR CERVICAL DISCECTOMY AND FUSION, ALLOGRAFT, PLATE;  Surgeon: Eldred Manges, MD;  Location: [facility] OR;  Service: Orthopedics;  Laterality: N/A;   COLONOSCOPY     COLONOSCOPY N/A 09/22/2016   Procedure: COLONOSCOPY;  Surgeon: West Bali, MD;  [facility];  Service: Endoscopy;  Laterality: N/A;  1:45pm   COLONOSCOPY     COLONOSCOPY WITH PROPOFOL N/A 12/24/2021   Procedure: COLONOSCOPY WITH PROPOFOL;  Surgeon: Lanelle Bal, DO;  [facility];  Service: Endoscopy;  Laterality: N/A;  9:00 am   HERNIA REPAIR     triple hernia surgery   Social History   Occupational History   Occupation: Retired Advertising account executive: [employer]  Tobacco Use   Smoking status: Never   Smokeless tobacco: Never  Vaping Use   Vaping status: Never Used  Substance and Sexual Activity   Alcohol use: Yes    Alcohol/week: 1.0 - 4.0 standard drink of alcohol    Types: 1 - 4 Cans of beer per week   Drug use: No   Sexual activity: Yes

## 2022-11-26 ENCOUNTER — Telehealth: Payer: Self-pay | Admitting: Orthopaedic Surgery

## 2022-11-26 NOTE — Telephone Encounter (Signed)
Records re-faxed. IC advised patient of this and confirmation was received.

## 2022-11-26 NOTE — Telephone Encounter (Signed)
Patient called advised the doctor S said they did not receive the records for him. Patient asked if the records could be faxed again? Patient asked if he can get a call when the records are faxed again.  The number to contact patient is [phone] or [phone]

## 2022-12-17 DIAGNOSIS — I10 Essential (primary) hypertension: Secondary | ICD-10-CM | POA: Diagnosis not present

## 2022-12-30 ENCOUNTER — Encounter: Payer: Self-pay | Admitting: Orthopaedic Surgery

## 2023-01-20 DIAGNOSIS — M542 Cervicalgia: Secondary | ICD-10-CM | POA: Diagnosis not present

## 2023-01-20 DIAGNOSIS — M47812 Spondylosis without myelopathy or radiculopathy, cervical region: Secondary | ICD-10-CM | POA: Diagnosis not present

## 2023-02-02 DIAGNOSIS — E049 Nontoxic goiter, unspecified: Secondary | ICD-10-CM | POA: Diagnosis not present

## 2023-02-02 DIAGNOSIS — M4802 Spinal stenosis, cervical region: Secondary | ICD-10-CM | POA: Diagnosis not present

## 2023-02-02 DIAGNOSIS — M47812 Spondylosis without myelopathy or radiculopathy, cervical region: Secondary | ICD-10-CM | POA: Diagnosis not present

## 2023-02-02 DIAGNOSIS — M5023 Other cervical disc displacement, cervicothoracic region: Secondary | ICD-10-CM | POA: Diagnosis not present

## 2023-02-02 DIAGNOSIS — M542 Cervicalgia: Secondary | ICD-10-CM | POA: Diagnosis not present

## 2023-02-10 DIAGNOSIS — R293 Abnormal posture: Secondary | ICD-10-CM | POA: Diagnosis not present

## 2023-02-10 DIAGNOSIS — M542 Cervicalgia: Secondary | ICD-10-CM | POA: Diagnosis not present

## 2023-02-26 DIAGNOSIS — M5412 Radiculopathy, cervical region: Secondary | ICD-10-CM | POA: Diagnosis not present

## 2023-02-26 DIAGNOSIS — M47812 Spondylosis without myelopathy or radiculopathy, cervical region: Secondary | ICD-10-CM | POA: Diagnosis not present

## 2023-02-26 DIAGNOSIS — G8929 Other chronic pain: Secondary | ICD-10-CM | POA: Diagnosis not present

## 2023-03-05 DIAGNOSIS — M542 Cervicalgia: Secondary | ICD-10-CM | POA: Diagnosis not present

## 2023-03-05 DIAGNOSIS — R293 Abnormal posture: Secondary | ICD-10-CM | POA: Diagnosis not present

## 2023-03-11 DIAGNOSIS — R293 Abnormal posture: Secondary | ICD-10-CM | POA: Diagnosis not present

## 2023-03-11 DIAGNOSIS — M542 Cervicalgia: Secondary | ICD-10-CM | POA: Diagnosis not present

## 2023-03-17 ENCOUNTER — Other Ambulatory Visit (HOSPITAL_COMMUNITY): Payer: Self-pay | Admitting: Family Medicine

## 2023-03-17 DIAGNOSIS — E7849 Other hyperlipidemia: Secondary | ICD-10-CM | POA: Diagnosis not present

## 2023-03-17 DIAGNOSIS — I10 Essential (primary) hypertension: Secondary | ICD-10-CM | POA: Diagnosis not present

## 2023-03-17 DIAGNOSIS — E010 Iodine-deficiency related diffuse (endemic) goiter: Secondary | ICD-10-CM | POA: Diagnosis not present

## 2023-03-17 DIAGNOSIS — E782 Mixed hyperlipidemia: Secondary | ICD-10-CM | POA: Diagnosis not present

## 2023-03-17 DIAGNOSIS — Z6824 Body mass index (BMI) 24.0-24.9, adult: Secondary | ICD-10-CM | POA: Diagnosis not present

## 2023-03-17 DIAGNOSIS — Z0001 Encounter for general adult medical examination with abnormal findings: Secondary | ICD-10-CM | POA: Diagnosis not present

## 2023-03-17 DIAGNOSIS — G4733 Obstructive sleep apnea (adult) (pediatric): Secondary | ICD-10-CM | POA: Diagnosis not present

## 2023-03-25 DIAGNOSIS — R293 Abnormal posture: Secondary | ICD-10-CM | POA: Diagnosis not present

## 2023-03-25 DIAGNOSIS — M542 Cervicalgia: Secondary | ICD-10-CM | POA: Diagnosis not present

## 2023-03-26 ENCOUNTER — Encounter (HOSPITAL_COMMUNITY): Payer: Self-pay

## 2023-03-26 ENCOUNTER — Ambulatory Visit (HOSPITAL_COMMUNITY)

## 2023-03-27 ENCOUNTER — Ambulatory Visit (HOSPITAL_COMMUNITY)
Admission: RE | Admit: 2023-03-27 | Discharge: 2023-03-27 | Disposition: A | Discharge status: Home / Self Care | Source: Ambulatory Visit | Attending: Family Medicine | Admitting: Family Medicine

## 2023-03-27 DIAGNOSIS — E010 Iodine-deficiency related diffuse (endemic) goiter: Secondary | ICD-10-CM | POA: Insufficient documentation

## 2023-03-27 DIAGNOSIS — E042 Nontoxic multinodular goiter: Secondary | ICD-10-CM | POA: Diagnosis not present

## 2023-04-01 DIAGNOSIS — M542 Cervicalgia: Secondary | ICD-10-CM | POA: Diagnosis not present

## 2023-04-01 DIAGNOSIS — R293 Abnormal posture: Secondary | ICD-10-CM | POA: Diagnosis not present

## 2023-04-16 DIAGNOSIS — E042 Nontoxic multinodular goiter: Secondary | ICD-10-CM | POA: Diagnosis not present

## 2023-04-17 ENCOUNTER — Other Ambulatory Visit: Payer: Self-pay | Admitting: Nurse Practitioner

## 2023-04-17 DIAGNOSIS — E042 Nontoxic multinodular goiter: Secondary | ICD-10-CM

## 2023-04-21 DIAGNOSIS — M47812 Spondylosis without myelopathy or radiculopathy, cervical region: Secondary | ICD-10-CM | POA: Diagnosis not present

## 2023-04-21 DIAGNOSIS — Z981 Arthrodesis status: Secondary | ICD-10-CM | POA: Diagnosis not present

## 2023-04-21 DIAGNOSIS — M7918 Myalgia, other site: Secondary | ICD-10-CM | POA: Diagnosis not present

## 2023-05-06 ENCOUNTER — Ambulatory Visit: Admitting: Urology

## 2023-05-13 ENCOUNTER — Encounter: Payer: Self-pay | Admitting: Urology

## 2023-05-13 ENCOUNTER — Ambulatory Visit: Admitting: Urology

## 2023-05-13 VITALS — BP 153/75 | HR 60 | Ht 71.0 in | Wt 182.0 lb

## 2023-05-13 DIAGNOSIS — R351 Nocturia: Secondary | ICD-10-CM

## 2023-05-13 DIAGNOSIS — N401 Enlarged prostate with lower urinary tract symptoms: Secondary | ICD-10-CM | POA: Diagnosis not present

## 2023-05-13 DIAGNOSIS — R972 Elevated prostate specific antigen [PSA]: Secondary | ICD-10-CM | POA: Diagnosis not present

## 2023-05-13 LAB — URINALYSIS, ROUTINE W REFLEX MICROSCOPIC
Bilirubin, UA: NEGATIVE
Glucose, UA: NEGATIVE
Ketones, UA: NEGATIVE
Nitrite, UA: NEGATIVE
Protein,UA: NEGATIVE
RBC, UA: NEGATIVE
Specific Gravity, UA: 1.01 (ref 1.005–1.030)
Urobilinogen, Ur: 0.2 mg/dL (ref 0.2–1.0)
pH, UA: 6 (ref 5.0–7.5)

## 2023-05-13 LAB — MICROSCOPIC EXAMINATION
Bacteria, UA: NONE SEEN
RBC, Urine: NONE SEEN /HPF (ref 0–2)

## 2023-05-13 MED ORDER — TADALAFIL 5 MG PO TABS: 5.0000 mg | ORAL_TABLET | Freq: Every day | ORAL | 11 refills | Status: AC

## 2023-05-13 NOTE — Progress Notes (Signed)
 05/13/2023 2:04 PM   Stephen Kramer 05/13/1950 [id number]  Referring provider: Minus Amel, MD [address] [place],  [place] [address]  Elevated PSA   HPI: Stephen Kramer is a 73yo here for evaluation of elevated PSA. PSA 1.7 in 2021, 1.9 in 2022, 2.3 in 2023, 3.1 in 2024 and 5.0 in 2025. He has had longstanding nocturia but his LUTS have worsened in the past year. IPSS 21 QOL 4. Nocturia 3x. Urine stream is fair. He has urinary frequency every 1-3 hours depending on fluid consumption. He takes tadalafil 5mg  prn but he has noted improved urine stream.    PMH: Past Medical History:  Diagnosis Date   Anemia    Chest pain    a. 02/2014 MV: EF 67%, no ischemia/infarct.   Chronic prostatitis    Colon polyps    GERD (gastroesophageal reflux disease)    not bad   Hypertension    Nocturia     Surgical History: Past Surgical History:  Procedure Laterality Date   ANTERIOR CERVICAL DECOMP/DISCECTOMY FUSION N/A 11/21/2016   Procedure: C4-5, C5-6 ANTERIOR CERVICAL DISCECTOMY AND FUSION, ALLOGRAFT, PLATE;  Surgeon: Adah Acron, MD;  Location: [facility];  Service: Orthopedics;  Laterality: N/A;   COLONOSCOPY     COLONOSCOPY N/A 09/22/2016   Procedure: COLONOSCOPY;  Surgeon: Alyce Jubilee, MD;  [facility];  Service: Endoscopy;  Laterality: N/A;  1:45pm   COLONOSCOPY     COLONOSCOPY WITH PROPOFOL  N/A 12/24/2021   Procedure: COLONOSCOPY WITH PROPOFOL ;  Surgeon: Vinetta Greening, DO;  [facility];  Service: Endoscopy;  Laterality: N/A;  9:00 am   HERNIA REPAIR     triple hernia surgery    Home Medications:  Allergies as of 05/13/2023       Reactions   Feldene [piroxicam] Diarrhea        Medication List        Accurate as of May 13, 2023  2:04 PM. If you have any questions, ask your nurse or doctor.          acetaminophen  500 MG tablet Commonly known as: TYLENOL  Take 1,000 mg by mouth every 6 (six) hours as needed (pain.).   amLODipine   10 MG tablet Commonly known as: NORVASC  Take 10 mg by mouth every evening.   celecoxib  200 MG capsule Commonly known as: CELEBREX  Take 200 mg by mouth daily.   cetirizine  10 MG tablet Commonly known as: ZYRTEC  Take 10 mg by mouth daily.   donepezil  10 MG tablet Commonly known as: ARICEPT  TAKE 1 TABLET BY MOUTH daily   meclizine 25 MG tablet Commonly known as: ANTIVERT Take 25 mg by mouth 2 (two) times daily as needed for nausea.   methocarbamol  750 MG tablet Commonly known as: ROBAXIN  Take 750 mg by mouth.   multivitamin with minerals Tabs tablet Take 1 tablet by mouth daily.   naproxen sodium 220 MG tablet Commonly known as: ALEVE Take 440 mg by mouth 2 (two) times daily as needed (pain.).   olmesartan 40 MG tablet Commonly known as: BENICAR Take 20 mg by mouth in the morning.   predniSONE  10 MG (21) Tbpk tablet Commonly known as: STERAPRED UNI-PAK 21 TAB Take 6,5,4,3,2,1 one tablet less each day , take with food   QUNOL ULTRA COQ10 PO Take 1 capsule by mouth daily.   tadalafil 5 MG tablet Commonly known as: CIALIS Take 5 mg by mouth daily as needed for erectile dysfunction.  Allergies:  Allergies  Allergen Reactions   Feldene [Piroxicam] Diarrhea    Family History: Family History  Problem Relation Age of Onset   Colon cancer Brother    Hypertension Mother    Diabetes Mother    Dementia Mother    Hypertension Father    Dementia Father    Diabetes Sister    Diabetes Brother     Social History:  reports that he has never smoked. He has never used smokeless tobacco. He reports current alcohol use of about 1.0 - 4.0 standard drink of alcohol per week. He reports that he does not use drugs.  ROS: All other review of systems were reviewed and are negative except what is noted above in HPI  Physical Exam: BP (!) 153/75   Pulse 60   Ht 5\' 11"  (1.803 m)   Wt 182 lb (82.6 kg)   BMI 25.38 kg/m   Constitutional:  Alert and oriented, No acute  distress. HEENT: [place] AT, moist mucus membranes.  Trachea midline, no masses. Cardiovascular: No clubbing, cyanosis, or edema. Respiratory: Normal respiratory effort, no increased work of breathing. GI: Abdomen is soft, nontender, nondistended, no abdominal masses GU: No CVA tenderness. Circumcised phallus. No masses/lesions on penis, testis, scrotum. Prostate 40g smooth no nodules no induration.  Lymph: No cervical or inguinal lymphadenopathy. Skin: No rashes, bruises or suspicious lesions. Neurologic: Grossly intact, no focal deficits, moving all 4 extremities. Psychiatric: Normal mood and affect.  Laboratory Data: Lab Results  Component Value Date   WBC 5.1 11/19/2016   HGB 14.5 11/19/2016   HCT 43.9 11/19/2016   MCV 88.2 11/19/2016   PLT 186 11/19/2016    Lab Results  Component Value Date   CREATININE 0.91 11/19/2016    Lab Results  Component Value Date   PSA 1.18 06/25/2010   PSA 1.07 06/19/2009   PSA 0.91 05/24/2008    No results found for: "TESTOSTERONE"  No results found for: "HGBA1C"  Urinalysis    Component Value Date/Time   COLORURINE YELLOW 11/19/2016 1437   APPEARANCEUR CLEAR 11/19/2016 1437   LABSPEC 1.016 11/19/2016 1437   PHURINE 6.0 11/19/2016 1437   GLUCOSEU NEGATIVE 11/19/2016 1437   HGBUR NEGATIVE 11/19/2016 1437   HGBUR negative 06/19/2009 1017   BILIRUBINUR NEGATIVE 11/19/2016 1437   BILIRUBINUR n 06/25/2010 1047   KETONESUR NEGATIVE 11/19/2016 1437   PROTEINUR NEGATIVE 11/19/2016 1437   UROBILINOGEN 0.2 02/11/2014 2158   NITRITE NEGATIVE 11/19/2016 1437   LEUKOCYTESUR NEGATIVE 11/19/2016 1437    No results found for: "LABMICR", "WBCUA", "RBCUA", "LABEPIT", "MUCUS", "BACTERIA"  Pertinent Imaging:  No results found for this or any previous visit.  No results found for this or any previous visit.  No results found for this or any previous visit.  No results found for this or any previous visit.  No results found for this or any  previous visit.  No results found for this or any previous visit.  No results found for this or any previous visit.  No results found for this or any previous visit.   Assessment & Plan:    1. Elevated PSA (Primary) -IsoPSA, will call with results. If elevated we will proceed with prostate biopsy - Urinalysis, Routine w reflex microscopic  2. BPh with LUTS -we will trial tadalafil 5mg    3. Nocturia -tadalafil 5mg  daily  No follow-ups on file.  Johnie Nailer, MD  [facility] [place]

## 2023-05-13 NOTE — Patient Instructions (Signed)

## 2023-05-18 ENCOUNTER — Other Ambulatory Visit (HOSPITAL_COMMUNITY)
Admission: RE | Admit: 2023-05-18 | Discharge: 2023-05-18 | Disposition: A | Discharge status: Home / Self Care | Source: Ambulatory Visit | Attending: Student | Admitting: Student

## 2023-05-18 ENCOUNTER — Ambulatory Visit
Admission: RE | Admit: 2023-05-18 | Discharge: 2023-05-18 | Disposition: A | Discharge status: Home / Self Care | Source: Ambulatory Visit | Attending: Nurse Practitioner | Admitting: Nurse Practitioner

## 2023-05-18 DIAGNOSIS — E041 Nontoxic single thyroid nodule: Secondary | ICD-10-CM | POA: Diagnosis not present

## 2023-05-18 DIAGNOSIS — E042 Nontoxic multinodular goiter: Secondary | ICD-10-CM | POA: Insufficient documentation

## 2023-05-20 LAB — CYTOLOGY - NON PAP

## 2023-05-25 ENCOUNTER — Telehealth: Payer: Self-pay

## 2023-05-25 NOTE — Telephone Encounter (Signed)
 Message left to return call to office regarding ISO PSA results

## 2023-05-25 NOTE — Telephone Encounter (Signed)
-----   Message from CMA Kourtney B sent at 05/20/2023 11:28 AM EDT ----- ISO PSA results

## 2023-05-27 ENCOUNTER — Telehealth: Payer: Self-pay

## 2023-05-27 DIAGNOSIS — R972 Elevated prostate specific antigen [PSA]: Secondary | ICD-10-CM

## 2023-05-27 NOTE — Telephone Encounter (Signed)
-----   Message from Johnie Nailer sent at 05/26/2023  8:59 AM EDT ----- Prostate MRI ----- Message ----- From: Dorma Gash, CMA Sent: 05/20/2023  11:28 AM EDT To: Marco Severs, MD; [facility], LPN  ISO PSA results

## 2023-05-27 NOTE — Telephone Encounter (Signed)
 I called patient to inform patient of his ISOpsa results and MD recommendation to move forward with prostate MRI.  Imaging ordered.

## 2023-05-27 NOTE — Telephone Encounter (Signed)
 Patient scheduled for prostate MRI

## 2023-06-08 ENCOUNTER — Ambulatory Visit (HOSPITAL_COMMUNITY)
Admission: RE | Admit: 2023-06-08 | Discharge: 2023-06-08 | Disposition: A | Discharge status: Home / Self Care | Source: Ambulatory Visit | Attending: Urology | Admitting: Urology

## 2023-06-08 DIAGNOSIS — K5730 Diverticulosis of large intestine without perforation or abscess without bleeding: Secondary | ICD-10-CM | POA: Diagnosis not present

## 2023-06-08 DIAGNOSIS — R972 Elevated prostate specific antigen [PSA]: Secondary | ICD-10-CM | POA: Diagnosis present

## 2023-06-08 MED ORDER — GADOBUTROL 1 MMOL/ML IV SOLN
9.0000 mL | Freq: Once | INTRAVENOUS | Status: AC | PRN
  Administered 2023-06-08: 9 mL via INTRAVENOUS

## 2023-06-21 NOTE — Progress Notes (Unsigned)
 PATIENT: Stephen Kramer DOB: 14-Mar-1950  REASON FOR VISIT: follow up for mild memory disturbance HISTORY FROM: patient PRIMARY NEUROLOGIST: Dr. Rhea Cecil. [place]  HISTORY OF PRESENT ILLNESS: Today 06/22/23  MMSE 30/30. No changes in memory. Remains on Aricept  AM, separated it from from coffee, makes his mind clearer. Lives with his wife, driving, independent. His biggest worry is his strong family history of AD. He thinks mild forgetfulness is normal for age. His PSA was high, MRI negative for cancer, undergoing work up. Going to PT for neck pain. Going to have ESI next month. He is active with yard work, he took care of his wife with her knee placement. Trying to restore an old truck.   06/24/22 SS: MMSE 29/30. Feels memory is stable. Gets frustrated with himself if he forgets things. He has forgetfulness, went off without his jacket today, wanted to bring with him. Remains on Aricept , takes in the morning, gave him dreams at night. He basically lives alone, his wife is a Scientist, clinical (histocompatibility and immunogenetics) and travels from job. He manages the household, yard work, putting a new motor in his truck. He is not sedentary. No health issues.  No changes in memory.  06/20/21 SS: Stephen Kramer is here today for follow-up. Doing well. MMSE 30/30. Memory is doing well. His wife is a Scientist, clinical (histocompatibility and immunogenetics) she is on an out of town work Higher education careers adviser, he lives by himself. He does well. He drives without problem. Health is good. Tolerating Aricept  well, thinks it is helping. Sleeps well. Needs to get back into the gym. He is independent. His wife likes him to come annually for evaluation.   06/20/20 SS: Stephen Kramer is a 73 year old male with history of memory disturbance. MMSE 30/30 today. Remains on Aricept  10 mg AM. Forgets some things, such as his mask before leaving the house. Lives with his wife, does his own ADLs, pays the bills, does medications. No health issues. Feels like Aricept  is helping. Vertigo is doing well, takes 1-2 pills as needed.  Went to [facility] for 1 week recently, his wife is a Scientist, clinical (histocompatibility and immunogenetics). Both his parents had Dementia. Doing well today.  03/22/2019 SS: Stephen Kramer is a 73 year old male with history of memory disturbance. He takes Aricept  10 mg in the morning.  Switching it to the morning, has helped his sleeping.  He thinks his memory is better since last seen.  He continues to remain quite active, does a lot of projects in the yard.  He drives a car without difficulty.  He has trouble which short-term memory, and remembering people's names.  He has vertigo, takes meclizine.  He is overall doing well, denies any new problems or concerns.  Presents today for follow-up unaccompanied. BP elevated, reports got upset on the drive in from [place].  HISTORY 09/16/2018 SS: Stephen Kramer is a 73 year old male with history of memory disturbance. He feels his memory is stable. He has lost 12 lbs, but he has been trying to with diet and exercise. He lives with his wife. He is very active working in the yard, and doing house upkeep. He is currently insulating his garage. He drives a car well. He has noticed that he will go to sleep well at 10 pm, but wakes up around 2-3 am and can't go back to sleep. He feels his memory has improved and his focus is better. He continue to struggle with short term memory. His health has been good. He manages his finances well.  He presents today for follow-up unaccompanied.  REVIEW OF SYSTEMS: Out of a complete 14 system review of symptoms, the patient complains only of the following symptoms, and all other reviewed systems are negative.  See HPI  ALLERGIES: Allergies  Allergen Reactions   Feldene [Piroxicam] Diarrhea    HOME MEDICATIONS: Outpatient Medications Prior to Visit  Medication Sig Dispense Refill   acetaminophen  (TYLENOL ) 500 MG tablet Take 1,000 mg by mouth every 6 (six) hours as needed (pain.).     amLODipine  (NORVASC ) 10 MG tablet Take 10 mg by mouth every evening.  3   celecoxib   (CELEBREX ) 200 MG capsule Take 200 mg by mouth daily.     cetirizine  (ZYRTEC ) 10 MG tablet Take 10 mg by mouth daily.     Coenzyme Q10-Vitamin E (QUNOL ULTRA COQ10 PO) Take 1 capsule by mouth daily.     meclizine (ANTIVERT) 25 MG tablet Take 25 mg by mouth 2 (two) times daily as needed for nausea.     methocarbamol  (ROBAXIN ) 750 MG tablet Take 750 mg by mouth.     Multiple Vitamin (MULTIVITAMIN WITH MINERALS) TABS tablet Take 1 tablet by mouth daily.     naproxen sodium (ALEVE) 220 MG tablet Take 440 mg by mouth 2 (two) times daily as needed (pain.).     olmesartan (BENICAR) 40 MG tablet Take 20 mg by mouth in the morning.     predniSONE  (STERAPRED UNI-PAK 21 TAB) 10 MG (21) TBPK tablet Take 6,5,4,3,2,1 one tablet less each day , take with food 21 tablet 0   tadalafil  (CIALIS ) 5 MG tablet Take 1 tablet (5 mg total) by mouth daily. 30 tablet 11   donepezil  (ARICEPT ) 10 MG tablet TAKE 1 TABLET BY MOUTH daily 90 tablet 4   No facility-administered medications prior to visit.    PAST MEDICAL HISTORY: Past Medical History:  Diagnosis Date   Anemia    Chest pain    a. 02/2014 MV: EF 67%, no ischemia/infarct.   Chronic prostatitis    Colon polyps    GERD (gastroesophageal reflux disease)    not bad   Hypertension    Nocturia     PAST SURGICAL HISTORY: Past Surgical History:  Procedure Laterality Date   ANTERIOR CERVICAL DECOMP/DISCECTOMY FUSION N/A 11/21/2016   Procedure: C4-5, C5-6 ANTERIOR CERVICAL DISCECTOMY AND FUSION, ALLOGRAFT, PLATE;  Surgeon: Adah Acron, MD;  Location: [facility];  Service: Orthopedics;  Laterality: N/A;   COLONOSCOPY     COLONOSCOPY N/A 09/22/2016   Procedure: COLONOSCOPY;  Surgeon: Alyce Jubilee, MD;  [facility];  Service: Endoscopy;  Laterality: N/A;  1:45pm   COLONOSCOPY     COLONOSCOPY WITH PROPOFOL  N/A 12/24/2021   Procedure: COLONOSCOPY WITH PROPOFOL ;  Surgeon: Vinetta Greening, DO;  [facility];  Service: Endoscopy;   Laterality: N/A;  9:00 am   HERNIA REPAIR     triple hernia surgery    FAMILY HISTORY: Family History  Problem Relation Age of Onset   Colon cancer Brother    Hypertension Mother    Diabetes Mother    Dementia Mother    Hypertension Father    Dementia Father    Diabetes Sister    Diabetes Brother     SOCIAL HISTORY: Social History   Socioeconomic History   Marital status: Married    Spouse name: Ivin Marrow   Number of children: 0   Years of education: Not on file   Highest education level: High school graduate  Occupational History   Occupation: Retired Theatre stage manager  worker    Employer: [employer]  Tobacco Use   Smoking status: Never   Smokeless tobacco: Never  Vaping Use   Vaping status: Never Used  Substance and Sexual Activity   Alcohol use: Yes    Alcohol/week: 1.0 - 4.0 standard drink of alcohol    Types: 1 - 4 Cans of beer per week   Drug use: No   Sexual activity: Yes  Other Topics Concern   Not on file  Social History Narrative   Lives at home with wife   Right handed   2 cups of coffee daily   Social Drivers of Health   Financial Resource Strain: Not on file  Food Insecurity: Not on file  Transportation Needs: Not on file  Physical Activity: Not on file  Stress: Not on file  Social Connections: Not on file  Intimate Partner Violence: Not on file   PHYSICAL EXAM  Vitals:   06/22/23 1238 06/22/23 1244  BP: (!) 155/81 137/82  Pulse: 63   Resp: 15   SpO2: 96%    There is no height or weight on file to calculate BMI.  Generalized: Well developed, in no acute distress     06/22/2023   12:40 PM 06/24/2022    1:57 PM 06/20/2021    2:55 PM  MMSE - Mini Mental State Exam  Orientation to time 5 5 5   Orientation to Place 5 5 5   Registration 3 3 3   Attention/ Calculation 5 5 5   Recall 3 3 3   Language- name 2 objects 2 2 2   Language- repeat 1 1   Language- follow 3 step command 3 2   Language- read & follow direction 1 1   Write a sentence 1 1    Copy design 1 1   Total score 30 29    Neurological examination  Mentation: Alert oriented to time, place, history taking. Follows all commands speech and language fluent Cranial nerve II-XII: Pupils were equal round reactive to light. Extraocular movements were full, visual field were full on confrontational test. Facial sensation and strength were normal. Head turning and shoulder shrug  were normal and symmetric. Motor: The motor testing reveals 5 over 5 strength of all 4 extremities. Good symmetric motor tone is noted throughout.  Sensory: Sensory testing is intact to soft touch on all 4 extremities. No evidence of extinction is noted.  Coordination: Cerebellar testing reveals good finger-nose-finger and heel-to-shin bilaterally.  Gait and station: Gait is normal. Independent Reflexes: Deep tendon reflexes are symmetric and normal bilaterally.   DIAGNOSTIC DATA (LABS, IMAGING, TESTING) - I reviewed patient records, labs, notes, testing and imaging myself where available.  Lab Results  Component Value Date   WBC 5.1 11/19/2016   HGB 14.5 11/19/2016   HCT 43.9 11/19/2016   MCV 88.2 11/19/2016   PLT 186 11/19/2016      Component Value Date/Time   NA 136 11/19/2016 1400   K 3.6 11/19/2016 1400   CL 106 11/19/2016 1400   CO2 20 (L) 11/19/2016 1400   GLUCOSE 96 11/19/2016 1400   BUN 9 11/19/2016 1400   CREATININE 0.91 11/19/2016 1400   CALCIUM  9.1 11/19/2016 1400   PROT 7.1 11/19/2016 1400   ALBUMIN 3.9 11/19/2016 1400   AST 27 11/19/2016 1400   ALT 25 11/19/2016 1400   ALKPHOS 71 11/19/2016 1400   BILITOT 2.0 (H) 11/19/2016 1400   GFRNONAA >60 11/19/2016 1400   GFRAA >60 11/19/2016 1400   Lab Results  Component Value  Date   CHOL 116 02/11/2014   HDL 39 (L) 02/11/2014   LDLCALC 67 02/11/2014   TRIG 49 02/11/2014   CHOLHDL 3.0 02/11/2014   No results found for: HGBA1C Lab Results  Component Value Date   VITAMINB12 632 03/16/2018   Lab Results  Component Value  Date   TSH 1.01 06/25/2010   ASSESSMENT AND PLAN 73 y.o. year old male  has a past medical history of Anemia, Chest pain, Chronic prostatitis, Colon polyps, GERD (gastroesophageal reflux disease), Hypertension, and Nocturia. here with:  1. Mild memory disturbance   - Doing very well, denies any memory changes.  MMSE 30/30 - Continue Aricept  10 mg daily - Screen with ATN profile, APOE Alzheimer's risk  - Strong family history of Dementia - Encourage exercise, brain stimulating activity, management of vascular risk factors - Follow-up in 1 year or sooner if needed  Meds ordered this encounter  Medications   donepezil  (ARICEPT ) 10 MG tablet    Sig: TAKE 1 TABLET BY MOUTH daily    Dispense:  90 tablet    Refill:  4   Orders Placed This Encounter  Procedures   ATN PROFILE   APOE Alzheimer's Risk   Jeanmarie Millet, AGNP-C, DNP 06/22/2023, 1:01 PM [facility] [address], [address] [place], [place] [address] [phone]

## 2023-06-22 ENCOUNTER — Encounter: Payer: Self-pay | Admitting: Neurology

## 2023-06-22 ENCOUNTER — Ambulatory Visit: Admitting: Neurology

## 2023-06-22 VITALS — BP 137/82 | HR 63 | Resp 15

## 2023-06-22 DIAGNOSIS — R413 Other amnesia: Secondary | ICD-10-CM | POA: Diagnosis not present

## 2023-06-22 MED ORDER — DONEPEZIL HCL 10 MG PO TABS: ORAL_TABLET | ORAL | 4 refills | Status: AC

## 2023-06-22 NOTE — Patient Instructions (Signed)
 Check labs today to screen for Alzheimer's  Continue Aricept  Recommend exercise, brain stimulating activities, management of vascular risk factors to promote brain health

## 2023-06-30 ENCOUNTER — Ambulatory Visit: Payer: Medicare Other | Admitting: Neurology

## 2023-07-02 LAB — ATN PROFILE
A -- Beta-amyloid 42/40 Ratio: 0.088 — ABNORMAL LOW (ref >0.102)
Beta-amyloid 40: 194.96 pg/mL
Beta-amyloid 42: 17.24 pg/mL
N -- NfL, Plasma: 1.61 pg/mL (ref 0.00–6.04)
T -- p-tau181: 0.74 pg/mL (ref 0.00–0.97)

## 2023-07-02 LAB — APOE ALZHEIMER'S RISK

## 2023-07-08 ENCOUNTER — Telehealth: Payer: Self-pay | Admitting: Neurology

## 2023-07-08 ENCOUNTER — Ambulatory Visit: Payer: Self-pay | Admitting: Neurology

## 2023-07-08 NOTE — Telephone Encounter (Signed)
 I called the patient, ATN profile showed A+T-N-. 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 APOE E2/E3, negative for APOE4 variant associated with increased risk for late onset Alzheimer's disease.  His memory has been quite stable, memory testing was 30/30.  We decided to continue to monitor, will have a revisit in 1 year.  If he notices any memory changes before then, he will contact me.  We discussed considering an MRI of the brain, even amyloid PET scan.  He has decided to pick up our discussion in 1 year.  I asked him to bring his wife at his next appointment who is a Scientist, clinical (histocompatibility and immunogenetics).

## 2023-07-21 DIAGNOSIS — M7918 Myalgia, other site: Secondary | ICD-10-CM | POA: Diagnosis not present

## 2023-07-21 DIAGNOSIS — Z981 Arthrodesis status: Secondary | ICD-10-CM | POA: Diagnosis not present

## 2023-07-21 DIAGNOSIS — M47812 Spondylosis without myelopathy or radiculopathy, cervical region: Secondary | ICD-10-CM | POA: Diagnosis not present

## 2023-07-24 DIAGNOSIS — H43393 Other vitreous opacities, bilateral: Secondary | ICD-10-CM | POA: Diagnosis not present

## 2023-07-24 DIAGNOSIS — H2513 Age-related nuclear cataract, bilateral: Secondary | ICD-10-CM | POA: Diagnosis not present

## 2023-07-27 DIAGNOSIS — M47812 Spondylosis without myelopathy or radiculopathy, cervical region: Secondary | ICD-10-CM | POA: Diagnosis not present

## 2023-08-11 DIAGNOSIS — M47812 Spondylosis without myelopathy or radiculopathy, cervical region: Secondary | ICD-10-CM | POA: Diagnosis not present

## 2023-08-25 ENCOUNTER — Ambulatory Visit: Payer: Self-pay

## 2023-08-25 DIAGNOSIS — R972 Elevated prostate specific antigen [PSA]: Secondary | ICD-10-CM

## 2023-08-25 NOTE — Telephone Encounter (Signed)
-----   Message from Belvie Clara sent at 08/25/2023 10:50 AM EDT ----- Please send for fusion biopsy ----- Message ----- From: Interface, Rad Results In Sent: 06/15/2023   2:52 PM EDT To: Belvie LITTIE Clara, MD

## 2023-08-25 NOTE — Telephone Encounter (Signed)
 Patient called with no answer. Detailed message left. Referral placed

## 2023-09-29 DIAGNOSIS — R8279 Other abnormal findings on microbiological examination of urine: Secondary | ICD-10-CM | POA: Diagnosis not present

## 2023-09-29 DIAGNOSIS — R300 Dysuria: Secondary | ICD-10-CM | POA: Diagnosis not present

## 2023-10-30 ENCOUNTER — Other Ambulatory Visit: Payer: Self-pay | Admitting: Nurse Practitioner

## 2023-10-30 DIAGNOSIS — E042 Nontoxic multinodular goiter: Secondary | ICD-10-CM

## 2023-11-09 ENCOUNTER — Encounter: Payer: Self-pay | Admitting: Radiology

## 2023-11-16 ENCOUNTER — Other Ambulatory Visit

## 2023-11-16 DIAGNOSIS — R972 Elevated prostate specific antigen [PSA]: Secondary | ICD-10-CM

## 2023-11-17 ENCOUNTER — Ambulatory Visit: Payer: Self-pay | Admitting: Urology

## 2023-11-17 LAB — PSA, TOTAL AND FREE
PSA, Free Pct: 13.2 %
PSA, Free: 0.5 ng/mL
Prostate Specific Ag, Serum: 3.8 ng/mL (ref 0.0–4.0)

## 2023-11-20 ENCOUNTER — Ambulatory Visit: Admitting: Urology

## 2023-11-20 VITALS — BP 136/72 | HR 72

## 2023-11-20 DIAGNOSIS — R972 Elevated prostate specific antigen [PSA]: Secondary | ICD-10-CM

## 2023-11-20 LAB — URINALYSIS, ROUTINE W REFLEX MICROSCOPIC
Bilirubin, UA: NEGATIVE
Glucose, UA: NEGATIVE
Nitrite, UA: NEGATIVE
RBC, UA: NEGATIVE
Specific Gravity, UA: 1.02 (ref 1.005–1.030)
Urobilinogen, Ur: 1 mg/dL (ref 0.2–1.0)
pH, UA: 6 (ref 5.0–7.5)

## 2023-11-20 LAB — MICROSCOPIC EXAMINATION: Bacteria, UA: NONE SEEN

## 2023-11-20 MED ORDER — DIAZEPAM 10 MG PO TABS: 10.0000 mg | ORAL_TABLET | Freq: Once | ORAL | 0 refills | Status: AC

## 2023-11-20 NOTE — Progress Notes (Signed)
 11/20/2023 1:24 PM   Stephen Kramer [birth year] [id number]  Referring provider: Marvine Rush, MD [address] [place],  [place] [address]  Followup elevated PSA   HPI: Stephen Kramer is a 73yo here for followup for elevated PSA. IsoPSA was 9.5. 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 He was scheduled for fusion biopsy but it was cancelled due to UTI. He is rescheduling with Dr. Elisabeth for the biopsy. Most recent PSA 3.8   PMH: Past Medical History:  Diagnosis Date   Anemia    Chest pain    a. 02/2014 MV: EF 67%, no ischemia/infarct.   Chronic prostatitis    Colon polyps    GERD (gastroesophageal reflux disease)    not bad   Hypertension    Nocturia     Surgical History: Past Surgical History:  Procedure Laterality Date   ANTERIOR CERVICAL DECOMP/DISCECTOMY FUSION N/A 11/21/2016   Procedure: C4-5, C5-6 ANTERIOR CERVICAL DISCECTOMY AND FUSION, ALLOGRAFT, PLATE;  Surgeon: Barbarann Oneil BROCKS, MD;  Location: [facility] OR;  Service: Orthopedics;  Laterality: N/A;   COLONOSCOPY     COLONOSCOPY N/A 09/22/2016   Procedure: COLONOSCOPY;  Surgeon: Harvey Margo LITTIE, MD;  [facility];  Service: Endoscopy;  Laterality: N/A;  1:45pm   COLONOSCOPY     COLONOSCOPY WITH PROPOFOL  N/A 12/24/2021   Procedure: COLONOSCOPY WITH PROPOFOL ;  Surgeon: Cindie Carlin POUR, DO;  [facility];  Service: Endoscopy;  Laterality: N/A;  9:00 am   HERNIA REPAIR     triple hernia surgery    Home Medications:  Allergies as of 11/20/2023       Reactions   Feldene [piroxicam] Diarrhea        Medication List        Accurate as of November 20, 2023  1:24 PM. If you have any questions, ask your nurse or doctor.          acetaminophen  500 MG tablet Commonly known as: TYLENOL  Take 1,000 mg by mouth every 6 (six) hours as needed (pain.).   amLODipine  10 MG tablet Commonly known as: NORVASC  Take 10 mg by mouth every evening.   celecoxib  200 MG capsule Commonly known as:  CELEBREX  Take 200 mg by mouth daily.   cetirizine  10 MG tablet Commonly known as: ZYRTEC  Take 10 mg by mouth daily.   donepezil  10 MG tablet Commonly known as: ARICEPT  TAKE 1 TABLET BY MOUTH daily   meclizine 25 MG tablet Commonly known as: ANTIVERT Take 25 mg by mouth 2 (two) times daily as needed for nausea.   multivitamin with minerals Tabs tablet Take 1 tablet by mouth daily.   naproxen sodium 220 MG tablet Commonly known as: ALEVE Take 440 mg by mouth 2 (two) times daily as needed (pain.).   olmesartan 40 MG tablet Commonly known as: BENICAR Take 20 mg by mouth in the morning.   predniSONE  10 MG (21) Tbpk tablet Commonly known as: STERAPRED UNI-PAK 21 TAB Take 6,5,4,3,2,1 one tablet less each day , take with food   QUNOL ULTRA COQ10 PO Take 1 capsule by mouth daily.   tadalafil  5 MG tablet Commonly known as: CIALIS  Take 1 tablet (5 mg total) by mouth daily.        Allergies:  Allergies  Allergen Reactions   Feldene [Piroxicam] Diarrhea    Family History: Family History  Problem Relation Age of Onset   Colon cancer Brother    Hypertension Mother    Diabetes Mother  Dementia Mother    Hypertension Father    Dementia Father    Diabetes Sister    Diabetes Brother     Social History:  reports that he has never smoked. He has never used smokeless tobacco. He reports current alcohol use of about 1.0 - 4.0 standard drink of alcohol per week. He reports that he does not use drugs.  ROS: All other review of systems were reviewed and are negative except what is noted above in HPI  Physical Exam: BP 136/72   Pulse 72   Constitutional:  Alert and oriented, No acute distress. HEENT: [place] AT, moist mucus membranes.  Trachea midline, no masses. Cardiovascular: No clubbing, cyanosis, or edema. Respiratory: Normal respiratory effort, no increased work of breathing. GI: Abdomen is soft, nontender, nondistended, no abdominal masses GU: No CVA tenderness.   Lymph: No cervical or inguinal lymphadenopathy. Skin: No rashes, bruises or suspicious lesions. Neurologic: Grossly intact, no focal deficits, moving all 4 extremities. Psychiatric: Normal mood and affect.  Laboratory Data: Lab Results  Component Value Date   WBC 5.1 11/19/2016   HGB 14.5 11/19/2016   HCT 43.9 11/19/2016   MCV 88.2 11/19/2016   PLT 186 11/19/2016    Lab Results  Component Value Date   CREATININE 0.91 11/19/2016    Lab Results  Component Value Date   PSA 1.18 06/25/2010   PSA 1.07 06/19/2009   PSA 0.91 05/24/2008    No results found for: TESTOSTERONE  No results found for: HGBA1C  Urinalysis    Component Value Date/Time   COLORURINE YELLOW 11/19/2016 1437   APPEARANCEUR Clear 05/13/2023 1359   LABSPEC 1.016 11/19/2016 1437   PHURINE 6.0 11/19/2016 1437   GLUCOSEU Negative 05/13/2023 1359   HGBUR NEGATIVE 11/19/2016 1437   HGBUR negative 06/19/2009 1017   BILIRUBINUR Negative 05/13/2023 1359   KETONESUR NEGATIVE 11/19/2016 1437   PROTEINUR Negative 05/13/2023 1359   PROTEINUR NEGATIVE 11/19/2016 1437   UROBILINOGEN 0.2 02/11/2014 2158   NITRITE Negative 05/13/2023 1359   NITRITE NEGATIVE 11/19/2016 1437   LEUKOCYTESUR Trace (A) 05/13/2023 1359    Lab Results  Component Value Date   LABMICR See below: 05/13/2023   WBCUA 0-5 05/13/2023   LABEPIT 0-10 05/13/2023   BACTERIA None seen 05/13/2023    Pertinent Imaging: *** No results found for this or any previous visit.  No results found for this or any previous visit.  No results found for this or any previous visit.  No results found for this or any previous visit.  No results found for this or any previous visit.  No results found for this or any previous visit.  No results found for this or any previous visit.  No results found for this or any previous visit.   Assessment & Plan:    1. Elevated PSA (Primary) Patient to proceed with Fusion Prostate Biopsy. Followup  after biopsy - Urinalysis, Routine w reflex microscopic   No follow-ups on file.  Belvie Clara, MD  [facility] [place]

## 2023-11-26 ENCOUNTER — Other Ambulatory Visit (HOSPITAL_COMMUNITY)
Admission: RE | Admit: 2023-11-26 | Discharge: 2023-11-26 | Disposition: A | Discharge status: Home / Self Care | Source: Ambulatory Visit | Attending: Nurse Practitioner | Admitting: Nurse Practitioner

## 2023-11-26 ENCOUNTER — Ambulatory Visit
Admission: RE | Admit: 2023-11-26 | Discharge: 2023-11-26 | Disposition: A | Discharge status: Home / Self Care | Source: Ambulatory Visit | Attending: Nurse Practitioner | Admitting: Nurse Practitioner

## 2023-11-26 ENCOUNTER — Encounter: Payer: Self-pay | Admitting: Urology

## 2023-11-26 DIAGNOSIS — E041 Nontoxic single thyroid nodule: Secondary | ICD-10-CM

## 2023-11-26 DIAGNOSIS — E042 Nontoxic multinodular goiter: Secondary | ICD-10-CM

## 2023-11-26 NOTE — Patient Instructions (Signed)

## 2023-11-30 LAB — CYTOLOGY - NON PAP

## 2023-12-07 ENCOUNTER — Ambulatory Visit: Attending: Cardiovascular Disease | Admitting: Cardiovascular Disease

## 2023-12-07 ENCOUNTER — Encounter: Payer: Self-pay | Admitting: Cardiovascular Disease

## 2023-12-07 VITALS — BP 130/64 | HR 55 | Ht 71.0 in | Wt 186.6 lb

## 2023-12-07 DIAGNOSIS — I493 Ventricular premature depolarization: Secondary | ICD-10-CM | POA: Diagnosis not present

## 2023-12-07 DIAGNOSIS — I10 Essential (primary) hypertension: Secondary | ICD-10-CM | POA: Diagnosis not present

## 2023-12-07 DIAGNOSIS — R079 Chest pain, unspecified: Secondary | ICD-10-CM | POA: Diagnosis not present

## 2023-12-07 NOTE — Patient Instructions (Addendum)
 Medication Instructions:  No changes *If you need a refill on your cardiac medications before your next appointment, please call your pharmacy*  Lab Work: None ordered If you have labs (blood work) drawn today and your tests are completely normal, you will receive your results only by: MyChart Message (if you have MyChart) OR A paper copy in the mail If you have any lab test that is abnormal or we need to change your treatment, we will call you to review the results.  Testing/Procedures:   Your cardiac CT will be scheduled at:   [facility] [address]  [place], [place] [address]  If scheduled at the [facility] at [address], please enter the parking lot using the [address] entrance and use the FREE valet service at the patient drop-off area. Enter the building and check-in with registration on the main floor.  Please follow these instructions carefully (unless otherwise directed):  An IV will be required for this test and Nitroglycerin  will be given.  Hold all erectile dysfunction medications at least 2 days (48 hrs) prior to test. (Ie viagra, cialis , sildenafil, tadalafil , etc)   On the Night Before the Test: Be sure to Drink plenty of water . Do not consume any caffeinated/decaffeinated beverages or chocolate 12 hours prior to your test. Do not take any antihistamines 12 hours prior to your test.  On the Day of the Test: Drink plenty of water  until 1 hour prior to the test. Do not eat any food 1 hour prior to test. You may take your regular medications prior to the test. (Beta-blocker not needed before test due to low heart rate)  After the Test: Drink plenty of water . After receiving IV contrast, you may experience a mild flushed feeling. This is normal. On occasion, you may experience a mild rash up to 24 hours after the test. This is not dangerous. If this occurs, you can take Benadryl 25 mg, Zyrtec , Claritin , or  Allegra and increase your fluid intake. (Patients taking Tikosyn should avoid Benadryl, and may take Zyrtec , Claritin , or Allegra) If you experience trouble breathing, this can be serious. If it is severe call 911 IMMEDIATELY. If it is mild, please call our office.  We will call to schedule your test 2-4 weeks out understanding that some insurance companies will need an authorization prior to the service being performed.   For more information and frequently asked questions, please visit our website : [email or web address]  For non-scheduling related questions, please contact the cardiac imaging nurse navigator should you have any questions/concerns: Cardiac Imaging Nurse Navigators Direct Office Dial: [phone]   For scheduling needs, including cancellations and rescheduling, please call Brittany, [phone].   Follow-Up: At [facility], you and your health needs are our priority.  As part of our continuing mission to provide you with exceptional heart care, our providers are all part of one team.  This team includes your primary Cardiologist (physician) and Advanced Practice Providers or APPs (Physician Assistants and Nurse Practitioners) who all work together to provide you with the care you need, when you need it.  Your next appointment:    Follow up as needed  Provider:   Dr Francyne  We recommend signing up for the patient portal called MyChart.  Sign up information is provided on this After Visit Summary.  MyChart is used to connect with patients for Virtual Visits (Telemedicine).  Patients are able to view lab/test results, encounter notes, upcoming appointments, etc.  Non-urgent messages can be sent to your provider as well.   To learn more about what you can do with MyChart, go to [email or web address].

## 2023-12-08 NOTE — Progress Notes (Signed)
 Cardiology Office Note:    Date:  12/08/2023   ID:  Stephen Kramer, DOB 12-07-1950, MRN [id number]  PCP:  Marvine Rush, MD   [facility] Providers Cardiologist:  None     Referring MD: Marvine Rush, MD   No chief complaint on file. Stephen Kramer is a 73 y.o. male who is being seen today for the evaluation of palpitations and chest discomfort at the request of Marvine Rush, MD.   History of Present Illness:    Stephen Kramer is a 73 y.o. male with a hx of with premature ventricular contractions who presents with chest pain. He was referred by Dr. Marvine for evaluation of palpitations and premature ventricular contractions.  He experiences intermittent chest pain described as 'weird' and not really painful, lasting a few minutes and associated with stress, pressure, and tension. The episodes occur more frequently when he is stressed or upset. He does not experience chest pain during physical activities such as working in the yard, mowing the lawn, or climbing stairs, although he does feel tired. A normal stress test was conducted nine years ago.  His doctor told him that premature ventricular contractions (PVCs) were seen on his EKG. He notices that PVCs are more noticeable when he consumes strong coffee or lacks sleep. He is currently taking carvedilol for his PVCs.  He has a history of high cholesterol, which is being managed, and well-controlled high blood pressure. He denies smoking and diabetes.  He is not aware of a strong family history of coronary or other vascular problems, although multiple family members have diabetes and hypertension.  He is also on donepezil  due to some subjective complaints of memory problems (especially remembering names), a family history of Alzheimer's dementia, although he reports passing previous cognitive tests. He takes daily tadalafil  for prostate issues.  He has never taken nitroglycerin  or long-acting nitrates.  He has a history  of prostate issues, with a PSA level that decreased from 4 to 3.8, and an MRI showing something on the prostate, leading to a recommendation for a biopsy, which he is hesitant about due to past experiences with hemorrhoids and concerns about bleeding.  Past Medical History:  Diagnosis Date   Anemia    Chest pain    a. 02/2014 MV: EF 67%, no ischemia/infarct.   Chronic prostatitis    Colon polyps    Elevated PSA    GERD (gastroesophageal reflux disease)    not bad   HLD (hyperlipidemia)    Hypertension    Multinodular goiter    Nocturia     Past Surgical History:  Procedure Laterality Date   ANTERIOR CERVICAL DECOMP/DISCECTOMY FUSION N/A 11/21/2016   Procedure: C4-5, C5-6 ANTERIOR CERVICAL DISCECTOMY AND FUSION, ALLOGRAFT, PLATE;  Surgeon: Barbarann Oneil BROCKS, MD;  Location: [facility] OR;  Service: Orthopedics;  Laterality: N/A;   COLONOSCOPY     COLONOSCOPY N/A 09/22/2016   Procedure: COLONOSCOPY;  Surgeon: Harvey Margo LITTIE, MD;  Location: [facility] END[facility] SUITE;  Service: Endoscopy;  Laterality: N/A;  1:45pm   COLONOSCOPY     COLONOSCOPY WITH PROPOFOL  N/A 12/24/2021   Procedure: COLONOSCOPY WITH PROPOFOL ;  Surgeon: Cindie Carlin POUR, DO;  Location: [facility];  Service: Endoscopy;  Laterality: N/A;  9:00 am   HERNIA REPAIR     triple hernia surgery    Current Medications: Current Meds  Medication Sig   acetaminophen  (TYLENOL ) 500 MG tablet Take 1,000 mg by mouth every 6 (six) hours as needed (pain.).  amLODipine  (NORVASC ) 10 MG tablet Take 10 mg by mouth every evening.   celecoxib  (CELEBREX ) 200 MG capsule Take 200 mg by mouth daily.   cetirizine  (ZYRTEC ) 10 MG tablet Take 10 mg by mouth daily.   Coenzyme Q10-Vitamin E (QUNOL ULTRA COQ10 PO) Take 1 capsule by mouth daily.   donepezil  (ARICEPT ) 10 MG tablet TAKE 1 TABLET BY MOUTH daily   Multiple Vitamin (MULTIVITAMIN WITH MINERALS) TABS tablet Take 1 tablet by mouth daily.   naproxen sodium (ALEVE) 220 MG tablet Take 440 mg by mouth 2 (two)  times daily as needed (pain.).   olmesartan (BENICAR) 40 MG tablet Take 40 mg by mouth daily.   tadalafil  (CIALIS ) 5 MG tablet Take 1 tablet (5 mg total) by mouth daily.     Allergies:   Piroxicam   Social History   Socioeconomic History   Marital status: Married    Spouse name: Zelda   Number of children: 0   Years of education: Not on file   Highest education level: High school graduate  Occupational History   Occupation: Retired Advertising Account Executive: [employer]  Tobacco Use   Smoking status: Never   Smokeless tobacco: Never  Vaping Use   Vaping status: Never Used  Substance and Sexual Activity   Alcohol use: Yes    Alcohol/week: 1.0 - 4.0 standard drink of alcohol    Types: 1 - 4 Cans of beer per week   Drug use: No   Sexual activity: Yes  Other Topics Concern   Not on file  Social History Narrative   Lives at home with wife   Right handed   2 cups of coffee daily   Social Drivers of Corporate Investment Banker Strain: Not on file  Food Insecurity: Not on file  Transportation Needs: Not on file  Physical Activity: Not on file  Stress: Not on file  Social Connections: Not on file     Family History: The patient's family history includes Colon cancer in his brother; Dementia in his father and mother; Diabetes in his brother, mother, and sister; Hypertension in his father and mother.  ROS:   Please see the history of present illness.     All other systems reviewed and are negative.  EKGs/Labs/Other Studies Reviewed:    The following studies were reviewed today:  EKG Interpretation Date/Time:  Monday December 07 2023 16:37:38 EST Ventricular Rate:  55 PR Interval:  152 QRS Duration:  94 QT Interval:  422 QTC Calculation: 403 R Axis:   -34  Text Interpretation: Sinus bradycardia with occasional Premature ventricular complexes Left axis deviation Minimal voltage criteria for LVH, may be normal variant ( R in aVL ) When compared with ECG of  19-Nov-2016 14:04, Premature ventricular complexes are now Present Confirmed by Isak Sotomayor ([id number]) on 12/07/2023 5:05:42 PM    Recent Labs: No results found for requested labs within last 365 days.  Recent Lipid Panel    Component Value Date/Time   CHOL 116 02/11/2014 0721   TRIG 49 02/11/2014 0721   HDL 39 (L) 02/11/2014 0721   CHOLHDL 3.0 02/11/2014 0721   VLDL 10 02/11/2014 0721   LDLCALC 67 02/11/2014 0721     Risk Assessment/Calculations:                Physical Exam:    VS:  BP 130/64 (BP Location: Left Arm, Patient Position: Sitting, Cuff Size: Normal)   Pulse (!) 55   Ht 5'  11 (1.803 m)   Wt 186 lb 9.6 oz (84.6 kg)   SpO2 98%   BMI 26.03 kg/m     Wt Readings from Last 3 Encounters:  12/07/23 186 lb 9.6 oz (84.6 kg)  05/13/23 182 lb (82.6 kg)  07/24/22 179 lb (81.2 kg)     GEN: Appears relatively lean and fit, well nourished, well developed in no acute distress HEENT: Normal NECK: No JVD; No carotid bruits LYMPHATICS: No lymphadenopathy CARDIAC: RRR, no murmurs, rubs, gallops RESPIRATORY:  Clear to auscultation without rales, wheezing or rhonchi  ABDOMEN: Soft, non-tender, non-distended MUSCULOSKELETAL:  No edema; No deformity  SKIN: Warm and dry NEUROLOGIC:  Alert and oriented x 3 PSYCHIATRIC:  Normal affect   ASSESSMENT:    1. Chest pain of uncertain etiology   2. Premature ventricular contractions   3. Essential hypertension    PLAN:    In order of problems listed above:  Chest pain: Intermittent chest pain associated with stress and palpitations. No significant coronary artery disease on previous nuclear stress test but this was almost 10 years ago. Discussed potential coronary CT scan to assess for coronary artery disease, which involves nitroglycerin  administration to differentiate between true blockages and spasms. Advised to stop Cialis  two days prior to CT scan due to interaction with nitroglycerin . PVCs: Not always aware of them, but  his ECGs repeatedly show PVCs.  EKG shows premature ventricular contractions (PVCs). PVCs are common and often benign, we should evaluate for structural heart disease.  Discussed lifestyle modifications to reduce PVCs, including appropriate hydration, adequate sleep, and avoiding stimulants.  Hypertension:  Well-controlled with current medication regimen. Blood pressure is stable, but heart rate is relatively slow d, at least in part due to the donepezil , I would not give him empirical treatment with beta-blockers for his PVCs.  Continue current antihypertensive regimen. Metabolic risk factors: He has a good HDL cholesterol level and does not have diabetes mellitus. LDL cholesterol is 94, which is acceptable for someone without known heart disease but may need further reduction if coronary artery disease is present.  If he does not have coronary stenoses and calcium  score is low, no changes are recommended.  If we identify coronary stenosis or heavy burden of atherosclerosis based on calcium  score we may need to add a lipid-lowering agent.           Medication Adjustments/Labs and Tests Ordered: Current medicines are reviewed at length with the patient today.  Concerns regarding medicines are outlined above.  Orders Placed This Encounter  Procedures   CT CORONARY MORPH W/CTA COR W/SCORE W/CA W/CM &/OR WO/CM   EKG 12-Lead   No orders of the defined types were placed in this encounter.   Patient Instructions  Medication Instructions:  No changes *If you need a refill on your cardiac medications before your next appointment, please call your pharmacy*  Lab Work: None ordered If you have labs (blood work) drawn today and your tests are completely normal, you will receive your results only by: MyChart Message (if you have MyChart) OR A paper copy in the mail If you have any lab test that is abnormal or we need to change your treatment, we will call you to review the  results.  Testing/Procedures:   Your cardiac CT will be scheduled at:   [facility] [address]  [place], [place] [address]  If scheduled at the [facility] at [address], please enter the parking lot using  the [address] entrance and use the FREE valet service at the patient drop-off area. Enter the building and check-in with registration on the main floor.  Please follow these instructions carefully (unless otherwise directed):  An IV will be required for this test and Nitroglycerin  will be given.  Hold all erectile dysfunction medications at least 2 days (48 hrs) prior to test. (Ie viagra, cialis , sildenafil, tadalafil , etc)   On the Night Before the Test: Be sure to Drink plenty of water . Do not consume any caffeinated/decaffeinated beverages or chocolate 12 hours prior to your test. Do not take any antihistamines 12 hours prior to your test.  On the Day of the Test: Drink plenty of water  until 1 hour prior to the test. Do not eat any food 1 hour prior to test. You may take your regular medications prior to the test. (Beta-blocker not needed before test due to low heart rate)  After the Test: Drink plenty of water . After receiving IV contrast, you may experience a mild flushed feeling. This is normal. On occasion, you may experience a mild rash up to 24 hours after the test. This is not dangerous. If this occurs, you can take Benadryl 25 mg, Zyrtec , Claritin , or Allegra and increase your fluid intake. (Patients taking Tikosyn should avoid Benadryl, and may take Zyrtec , Claritin , or Allegra) If you experience trouble breathing, this can be serious. If it is severe call 911 IMMEDIATELY. If it is mild, please call our office.  We will call to schedule your test 2-4 weeks out understanding that some insurance companies will need an authorization prior to the service being performed.   For more information and frequently asked  questions, please visit our website : [email or web address]  For non-scheduling related questions, please contact the cardiac imaging nurse navigator should you have any questions/concerns: Cardiac Imaging Nurse Navigators Direct Office Dial: [phone]   For scheduling needs, including cancellations and rescheduling, please call Brittany, [phone].   Follow-Up: At [facility], you and your health needs are our priority.  As part of our continuing mission to provide you with exceptional heart care, our providers are all part of one team.  This team includes your primary Cardiologist (physician) and Advanced Practice Providers or APPs (Physician Assistants and Nurse Practitioners) who all work together to provide you with the care you need, when you need it.  Your next appointment:    Follow up as needed  Provider:   Dr Francyne  We recommend signing up for the patient portal called MyChart.  Sign up information is provided on this After Visit Summary.  MyChart is used to connect with patients for Virtual Visits (Telemedicine).  Patients are able to view lab/test results, encounter notes, upcoming appointments, etc.  Non-urgent messages can be sent to your provider as well.   To learn more about what you can do with MyChart, go to [email or web address].      Signed, Jerel Francyne, MD  12/08/2023 3:34 PM    [facility]

## 2023-12-23 ENCOUNTER — Encounter (HOSPITAL_COMMUNITY): Payer: Self-pay

## 2023-12-25 ENCOUNTER — Ambulatory Visit (HOSPITAL_COMMUNITY)
Admission: RE | Admit: 2023-12-25 | Discharge: 2023-12-25 | Disposition: A | Discharge status: Home / Self Care | Source: Ambulatory Visit | Attending: Cardiovascular Disease | Admitting: Cardiovascular Disease

## 2023-12-25 DIAGNOSIS — R079 Chest pain, unspecified: Secondary | ICD-10-CM | POA: Insufficient documentation

## 2023-12-25 MED ORDER — IOHEXOL 350 MG/ML SOLN
100.0000 mL | Freq: Once | INTRAVENOUS | Status: AC | PRN
  Administered 2023-12-25: 100 mL via INTRAVENOUS

## 2023-12-25 MED ORDER — NITROGLYCERIN 0.4 MG SL SUBL
0.8000 mg | SUBLINGUAL_TABLET | Freq: Once | SUBLINGUAL | Status: AC
  Administered 2023-12-25: 0.8 mg via SUBLINGUAL

## 2023-12-28 ENCOUNTER — Ambulatory Visit: Payer: Self-pay | Admitting: Cardiovascular Disease

## 2024-01-04 ENCOUNTER — Telehealth: Admitting: Emergency Medicine

## 2024-01-04 DIAGNOSIS — R300 Dysuria: Secondary | ICD-10-CM

## 2024-01-04 NOTE — Progress Notes (Signed)
" °  Because your age and sex and symptoms, I feel your condition warrants further evaluation and I recommend that you be seen in a face-to-face visit. I am very limited in what I can treat by evisit. I am sorry I cannot help you today.    NOTE: There will be NO CHARGE for this E-Visit   If you are having a true medical emergency, please call 911.     For an urgent face to face visit, [facility] has multiple urgent care centers for your convenience.  Click the link below for the full list of locations and hours, walk-in wait times, appointment scheduling options and driving directions:  Urgent Care - [place], [place], [place], [place], [place], [place]  [place]     Your MyChart E-visit questionnaire answers were reviewed by a board certified advanced clinical practitioner to complete your personal care plan based on your specific symptoms.    Thank you for using e-Visits.    "

## 2024-01-17 ENCOUNTER — Encounter: Payer: Self-pay | Admitting: Urology

## 2024-01-18 ENCOUNTER — Other Ambulatory Visit: Payer: Self-pay

## 2024-01-18 ENCOUNTER — Telehealth: Payer: Self-pay

## 2024-01-18 ENCOUNTER — Other Ambulatory Visit

## 2024-01-18 DIAGNOSIS — N390 Urinary tract infection, site not specified: Secondary | ICD-10-CM

## 2024-01-18 LAB — URINALYSIS, ROUTINE W REFLEX MICROSCOPIC
Bilirubin, UA: NEGATIVE
Glucose, UA: NEGATIVE
Ketones, UA: NEGATIVE
Nitrite, UA: NEGATIVE
Protein,UA: NEGATIVE
RBC, UA: NEGATIVE
Specific Gravity, UA: <1.005 — ABNORMAL LOW (ref 1.005–1.030)
Urobilinogen, Ur: 0.2 mg/dL (ref 0.2–1.0)
pH, UA: 6.5 (ref 5.0–7.5)

## 2024-01-18 LAB — MICROSCOPIC EXAMINATION
Bacteria, UA: NONE SEEN
Epithelial Cells (non renal): NONE SEEN /HPF (ref 0–10)
RBC, Urine: NONE SEEN /HPF (ref 0–2)

## 2024-01-18 NOTE — Telephone Encounter (Signed)
 Patient presents today with complaints of  UTI.  UA and Culture done today.  Dr. Sherrilee reviewed results and no treatment is needed. Patient aware of MD recommendations and that we will reach out with culture results.      [email or web address], CMA

## 2024-01-23 LAB — URINE CULTURE

## 2024-01-24 ENCOUNTER — Ambulatory Visit: Payer: Self-pay

## 2024-01-24 MED ORDER — DOXYCYCLINE HYCLATE 100 MG PO CAPS: 100.0000 mg | ORAL_CAPSULE | Freq: Two times a day (BID) | ORAL | 0 refills | Status: AC

## 2024-01-25 ENCOUNTER — Other Ambulatory Visit: Payer: Self-pay

## 2024-04-18 ENCOUNTER — Ambulatory Visit: Admitting: Urology

## 2024-06-23 ENCOUNTER — Ambulatory Visit: Admitting: Neurology
